# Patient Record
Sex: Female | Born: 1965 | Race: White | Hispanic: No | Smoking: Current every day smoker
Health system: Southern US, Community
[De-identification: ages and names within clinical notes are randomized; demographics above are authoritative.]

## PROBLEM LIST (undated history)

## (undated) DIAGNOSIS — E7800 Pure hypercholesterolemia, unspecified: Secondary | ICD-10-CM

## (undated) DIAGNOSIS — F4310 Post-traumatic stress disorder, unspecified: Secondary | ICD-10-CM

## (undated) DIAGNOSIS — F319 Bipolar disorder, unspecified: Secondary | ICD-10-CM

## (undated) DIAGNOSIS — F419 Anxiety disorder, unspecified: Secondary | ICD-10-CM

## (undated) DIAGNOSIS — M549 Dorsalgia, unspecified: Secondary | ICD-10-CM

## (undated) DIAGNOSIS — I10 Essential (primary) hypertension: Secondary | ICD-10-CM

## (undated) HISTORY — PX: ABLATION: SHX5711

## (undated) HISTORY — PX: BACK SURGERY: SHX140

## (undated) HISTORY — PX: TUBAL LIGATION: SHX77

---

## 2012-02-24 DIAGNOSIS — F339 Major depressive disorder, recurrent, unspecified: Secondary | ICD-10-CM | POA: Insufficient documentation

## 2015-02-06 DIAGNOSIS — D241 Benign neoplasm of right breast: Secondary | ICD-10-CM | POA: Insufficient documentation

## 2015-08-09 ENCOUNTER — Emergency Department (HOSPITAL_COMMUNITY): Payer: Medicaid Other

## 2015-08-09 ENCOUNTER — Emergency Department (HOSPITAL_COMMUNITY)
Admission: EM | Admit: 2015-08-09 | Discharge: 2015-08-09 | Discharge status: Against Medical Advice | Payer: Medicaid Other | Attending: Emergency Medicine | Admitting: Emergency Medicine

## 2015-08-09 ENCOUNTER — Encounter (HOSPITAL_COMMUNITY): Payer: Self-pay | Admitting: Family Medicine

## 2015-08-09 DIAGNOSIS — R0789 Other chest pain: Secondary | ICD-10-CM | POA: Insufficient documentation

## 2015-08-09 DIAGNOSIS — Z8639 Personal history of other endocrine, nutritional and metabolic disease: Secondary | ICD-10-CM | POA: Diagnosis not present

## 2015-08-09 DIAGNOSIS — Z8659 Personal history of other mental and behavioral disorders: Secondary | ICD-10-CM | POA: Diagnosis not present

## 2015-08-09 DIAGNOSIS — R51 Headache: Secondary | ICD-10-CM | POA: Diagnosis present

## 2015-08-09 DIAGNOSIS — R110 Nausea: Secondary | ICD-10-CM | POA: Insufficient documentation

## 2015-08-09 DIAGNOSIS — R519 Headache, unspecified: Secondary | ICD-10-CM

## 2015-08-09 DIAGNOSIS — Z880 Allergy status to penicillin: Secondary | ICD-10-CM | POA: Insufficient documentation

## 2015-08-09 DIAGNOSIS — I10 Essential (primary) hypertension: Secondary | ICD-10-CM | POA: Insufficient documentation

## 2015-08-09 DIAGNOSIS — Z720 Tobacco use: Secondary | ICD-10-CM | POA: Diagnosis not present

## 2015-08-09 DIAGNOSIS — R200 Anesthesia of skin: Secondary | ICD-10-CM | POA: Insufficient documentation

## 2015-08-09 DIAGNOSIS — R079 Chest pain, unspecified: Secondary | ICD-10-CM

## 2015-08-09 DIAGNOSIS — R0602 Shortness of breath: Secondary | ICD-10-CM | POA: Insufficient documentation

## 2015-08-09 HISTORY — DX: Pure hypercholesterolemia, unspecified: E78.00

## 2015-08-09 HISTORY — DX: Dorsalgia, unspecified: M54.9

## 2015-08-09 HISTORY — DX: Essential (primary) hypertension: I10

## 2015-08-09 HISTORY — DX: Bipolar disorder, unspecified: F31.9

## 2015-08-09 HISTORY — DX: Anxiety disorder, unspecified: F41.9

## 2015-08-09 HISTORY — DX: Post-traumatic stress disorder, unspecified: F43.10

## 2015-08-09 LAB — BASIC METABOLIC PANEL
ANION GAP: 12 (ref 5–15)
BUN: 8 mg/dL (ref 6–20)
CHLORIDE: 99 mmol/L — AB (ref 101–111)
CO2: 29 mmol/L (ref 22–32)
Calcium: 9.4 mg/dL (ref 8.9–10.3)
Creatinine, Ser: 0.78 mg/dL (ref 0.44–1.00)
GFR calc non Af Amer: >60 mL/min (ref >60)
Glucose, Bld: 89 mg/dL (ref 65–99)
POTASSIUM: 4.1 mmol/L (ref 3.5–5.1)
SODIUM: 140 mmol/L (ref 135–145)

## 2015-08-09 LAB — CBC
HEMATOCRIT: 45.5 % (ref 36.0–46.0)
HEMOGLOBIN: 14.9 g/dL (ref 12.0–15.0)
MCH: 31.6 pg (ref 26.0–34.0)
MCHC: 32.7 g/dL (ref 30.0–36.0)
MCV: 96.6 fL (ref 78.0–100.0)
PLATELETS: 305 10*3/uL (ref 150–400)
RBC: 4.71 MIL/uL (ref 3.87–5.11)
RDW: 13.3 % (ref 11.5–15.5)
WBC: 7.5 10*3/uL (ref 4.0–10.5)

## 2015-08-09 LAB — I-STAT TROPONIN, ED
TROPONIN I, POC: 0.01 ng/mL (ref 0.00–0.08)
Troponin i, poc: 0 ng/mL (ref 0.00–0.08)

## 2015-08-09 LAB — D-DIMER, QUANTITATIVE (NOT AT ARMC): D DIMER QUANT: 0.55 ug{FEU}/mL — AB (ref 0.00–0.48)

## 2015-08-09 MED ORDER — MORPHINE SULFATE (PF) 4 MG/ML IV SOLN
4.0000 mg | Freq: Once | INTRAVENOUS | Status: AC
  Administered 2015-08-09: 4 mg via INTRAVENOUS
  Filled 2015-08-09: qty 1

## 2015-08-09 MED ORDER — NITROGLYCERIN 0.4 MG/SPRAY TL SOLN
1.0000 | Status: DC | PRN
  Administered 2015-08-09: 1 via SUBLINGUAL
  Filled 2015-08-09: qty 4.9

## 2015-08-09 MED ORDER — IOHEXOL 350 MG/ML SOLN: 100.0000 mL | Freq: Once | INTRAVENOUS | Status: DC | PRN

## 2015-08-09 MED ORDER — ASPIRIN 81 MG PO CHEW: 324.0000 mg | CHEWABLE_TABLET | Freq: Once | ORAL | Status: DC

## 2015-08-09 MED ORDER — ONDANSETRON HCL 4 MG/2ML IJ SOLN
4.0000 mg | Freq: Once | INTRAMUSCULAR | Status: AC
  Administered 2015-08-09: 4 mg via INTRAVENOUS
  Filled 2015-08-09: qty 2

## 2015-08-09 NOTE — ED Provider Notes (Signed)
CSN: [id number]     Arrival date & time 08/09/15  1446 History   First MD Initiated Contact with Patient 08/09/15 1526     Chief Complaint  Patient presents with  . Chest Pain  . Numbness  . Headache   Patient is a 49 y.o. female presenting with chest pain and headaches.  Chest Pain Pain location:  L chest and R chest Pain quality: dull and pressure   Pain radiates to:  Does not radiate Pain radiates to the back: no   Pain severity:  Moderate Onset quality:  Gradual Timing:  Constant Progression:  Improving Chronicity:  New Context: at rest   Context: not breathing   Relieved by:  Nothing Associated symptoms: cough, headache, nausea, numbness and shortness of breath   Associated symptoms: no dizziness, no palpitations and no weakness   Headache Associated symptoms: cough, nausea and numbness   Associated symptoms: no diarrhea, no dizziness and no weakness     Past Medical History  Diagnosis Date  . Hypertension   . Anxiety   . Bipolar 1 disorder (HCC)   . PTSD (post-traumatic stress disorder)   . Back pain   . High cholesterol    Past Surgical History  Procedure Laterality Date  . Back surgery     History reviewed. No pertinent family history. Social History  Substance Use Topics  . Smoking status: Current Every Day Smoker  . Smokeless tobacco: None  . Alcohol Use: Yes   OB History    No data available     Review of Systems  Respiratory: Positive for cough, chest tightness and shortness of breath.   Cardiovascular: Positive for chest pain. Negative for palpitations and leg swelling.  Gastrointestinal: Positive for nausea. Negative for diarrhea.  Genitourinary: Negative for dysuria.  Neurological: Positive for speech difficulty, numbness and headaches. Negative for dizziness, tremors, weakness and light-headedness.  All other systems reviewed and are negative.     Allergies  Penicillins  Home Medications   Prior to Admission medications   Not on  File   BP 149/91 mmHg  Pulse 102  Temp(Src) 97.9 F (36.6 C) (Oral)  Resp 20  SpO2 95% Physical Exam  Constitutional: She is oriented to person, place, and time. She appears well-developed and well-nourished. No distress.  HENT:  Head: Normocephalic.  Eyes: Pupils are equal, round, and reactive to light.  Neck: Normal range of motion.  Cardiovascular: Normal rate.   No murmur heard. Pulmonary/Chest: Effort normal. No respiratory distress. She has no wheezes. She has no rales.  Abdominal: Soft. She exhibits no distension. There is no tenderness. There is no rebound and no guarding.  Musculoskeletal: She exhibits no edema.  Neurological: She is alert and oriented to person, place, and time. No cranial nerve deficit. She exhibits normal muscle tone. Coordination normal.  Reported left hand numbness along ulnar aspect  Normal finger to nose  Skin: Skin is warm and dry. No rash noted. She is not diaphoretic. No erythema.  Psychiatric: Her behavior is normal.  Nursing note and vitals reviewed.   ED Course  Procedures (including critical care time) Labs Review Labs Reviewed  BASIC METABOLIC PANEL - Abnormal; Notable for the following:    Chloride 99 (*)    All other components within normal limits  CBC  D-DIMER, QUANTITATIVE (NOT AT [facility])  Rosezena Sensor, ED    Imaging Review Dg Chest 2 View  08/09/2015  CLINICAL DATA:  Midsternal chest pain/ tightness and headache beginning today. Left hand  numbness since yesterday. Smoker. EXAM: CHEST  2 VIEW COMPARISON:  None. FINDINGS: The cardiomediastinal silhouette is within normal limits. There is mild central airway thickening. No confluent airspace opacity, edema, pleural effusion, or pneumothorax is identified. Mild thoracic dextroscoliosis is noted. IMPRESSION: Mild bronchitic changes. Electronically Signed   By: Sebastian AcheAllen  Grady M.D.   On: 08/09/2015 16:08   I have personally reviewed and evaluated these images and lab results as part  of my medical decision-making.   EKG Interpretation   Date/Time:  Wednesday August 09 2015 15:04:42 EDT Ventricular Rate:  100 PR Interval:  138 QRS Duration: 90 QT Interval:  358 QTC Calculation: 461 R Axis:   91 Text Interpretation:  Normal sinus rhythm Rightward axis Borderline ECG no  acute ST/T changes No old tracing to compare Confirmed by GOLDSTON  MD,  SCOTT ([id number]) on 08/09/2015 3:33:22 PM      MDM   Patient presents with multiple complaints including left hand numbness that started yesterday that is increasing in intensity as well as chest tightness and shortness of breath started today. Patient also complains of difficulty finding words and some intermittent dysarthria. Patient well-appearing. She has no neurological findings and her left hand numbness that appears to be on the ulnar aspect.  Patient low [place] Criteria but [facility] to [facility]. D-dimer is positive.   At this time patient is requesting to leave AGAINST MEDICAL ADVICE. She states that she has no other ride and that her ride is leaving. We discussed that she shouldn't leave with a positive d-dimer and that she could have a blood clot in her lungs. We also discussed at length our desire to continue a neurological workup.  Patient wants to leave against medical advice. Patient understands that his/her actions will lead to inadequate medical workup, and that he/she is at risk of complications of missed diagnosis, which includes morbidity and mortality.  Patient is demonstrating good capacity to make decision. Patient understands that he/she needs to return to the ER immediately if his/her symptoms get worse.  Final diagnoses:  Headache  SOB (shortness of breath)  Chest pain, unspecified chest pain type  Numbness of left hand      Deirdre PeerJeremiah Blayke Cordrey, MD 08/10/15 [id number]  Pricilla LovelessScott Goldston, MD 08/19/15 [phone]

## 2015-08-09 NOTE — ED Notes (Signed)
Pt here for chest pain that started today with headache. sts yesterday started with numbness in hand radiating to elbow on the left side.

## 2015-08-09 NOTE — Discharge Instructions (Signed)
°Emergency Department Resource Guide °1) Find a Doctor and Pay Out of Pocket °Although you won't have to find out who is covered by your insurance plan, it is a good idea to ask around and get recommendations. You will then need to call the office and see if the doctor you have chosen will accept you as a new patient and what types of options they offer for patients who are self-pay. Some doctors offer discounts or will set up payment plans for their patients who do not have insurance, but you will need to ask so you aren't surprised when you get to your appointment. ° °2) Contact Your Local Health Department °Not all health departments have doctors that can see patients for sick visits, but many do, so it is worth a call to see if yours does. If you don't know where your local health department is, you can check in your phone book. The CDC also has a tool to help you locate your state's health department, and many state websites also have listings of all of their local health departments. ° °3) Find a Walk-in Clinic °If your illness is not likely to be very severe or complicated, you may want to try a walk in clinic. These are popping up all over the country in pharmacies, drugstores, and shopping centers. They're usually staffed by nurse practitioners or physician assistants that have been trained to treat common illnesses and complaints. They're usually fairly quick and inexpensive. However, if you have serious medical issues or chronic medical problems, these are probably not your best option. ° °No Primary Care Doctor: °- Call [facility] at  832-8000 - they can help you locate a primary care doctor that  accepts your insurance, provides certain services, etc. °- Physician Referral Service- 1-800-533-3463 ° °Chronic Pain Problems: °Organization         Address  Phone   Notes  °[facility]  (336) 297-2271 Patients need to be referred by their primary care doctor.  ° °Medication  Assistance: °Organization         Address  Phone   Notes  °[place] [facility] [address] °[place], [place] [address] (336) 641-8030 --Must be a resident of [place] °-- Must have NO insurance coverage whatsoever (no Medicaid/ Medicare, etc.) °-- The pt. MUST have a primary care doctor that directs their care regularly and follows them in the community °  °[facility]  (866) 331-1348   °[employer]  (888) 892-1162   ° °Agencies that provide inexpensive medical care: °Organization         Address  Phone   Notes  ° Family Medicine  (336) 832-8035   ° Internal Medicine    (336) 832-7272   °[facility] [address] °[place], [place] [address] (336) 832-4777   °[facility] [address], °[place] (336) 271-4999   °[facility]    (336) 373-0678   °[facility]    (336) 272-1050   °[facility] ° [address], [place] Phone:  (336) 832-4444, Fax:  (336) 832-4440 Hours of Operation:  9 am - 6 pm, M-F.  Also accepts Medicaid/Medicare and self-pay.  °[facility] ° [address], [place] Phone: (336) 832-3150, Fax: (336) 832-3151. Hours of Operation:  8:30 am - 5:30 pm, M-F.  Also accepts Medicaid and self-pay.  °[facility] [place] [address], [place] Phone: (336) 878-6027   °[facility] [address], [place], [place] (336)723-1848, Ext. 123 Mondays & Thursdays: 7-9 AM.  First 15 patients are seen on a first come, first serve basis. °  ° °Medicaid-accepting [place] Providers: ° °Organization         Address  Phone   Notes  °[facility] [address], [place] (336) 641-2100 Also accepts self-pay patients.  °[facility] [address], [place] ° (336) 856-9996   °[facility] [address], [place]  (336) 288-8857   °[facility] [address] [place] [address], [place] (336) 299-7000   °Veita Bland [address], [place]  ° (336) 373-1557 Only accepts [place] Access Medicaid patients after they have their name applied to their card.  ° °Self-Pay (no insurance) in [place]: ° °Organization         Address  Phone   Notes  °Sickle Cell Patients, [facility] [address], [place] (336) 832-1970   °[facility] [address], [place] (336) 832-4400   ° [facility] ° [address], [place] (336) 992-4800   °[facility]/Dr. Osei-Bonsu ° [address] [place] [address], [place] or [address], [place] (336) 841-8500 Phone number for both [place] and [place] locations is the same.  °[facility] [address], [place] (336) 299-0000   °[facility] [address] [place] [address], [place] or [address] (336) 852-7530 °(336) 878-2260   °[facility] [address], [place] (336) 350-1642, phone; (336) 294-5005, fax Sees patients 1st and 3rd Saturday of every month.  Must not qualify for public or private insurance (i.e. Medicaid, Medicare, [place] Health Choice, Veterans' Benefits) • Household income should be no more than 200% of the poverty level •The clinic cannot treat you if you are pregnant or think you are pregnant • Sexually transmitted diseases are not treated at the clinic.  ° ° °Dental Care: °Organization         Address  Phone  Notes  °[place] [facility] [address], [place] (336) 641-6152 Accepts children up to age 21 who are enrolled in Medicaid or [place] Health Choice; pregnant women with a Medicaid card; and children who have applied for Medicaid or [place] Health Choice, but were declined, whose parents can pay a reduced fee at time of service.  °[place]  [facility] [place]  [address], [place] (336) 641-7733 Accepts children up to age 21 who are enrolled in Medicaid or [place] Health Choice; pregnant women with a Medicaid card; and children who have applied for Medicaid or [place] Health Choice, but were declined, whose parents can pay a reduced fee at time of service.  °[facility] ° [address],  (336) 641-4533 Patients are seen by appointment only. Walk-ins are not accepted. [facility] will see patients 18 years of age and older. °Monday - Tuesday (8am-5pm) °Most Wednesdays (8:30-5pm) °$30 per visit, cash only  °[facility] ° [address], [place] (336) 641-4533 Patients are seen by appointment only. Walk-ins are not accepted. [facility] will see patients 18 years of age and older. °One   Wednesday Evening (Monthly: Volunteer Based).  $30 per visit, cash only  °[facility]  (919) 537-3737 for adults; Children under age 4, call Graduate Pediatric Dentistry at (919) [phone]. Children aged 4-14, please call (919) 537-3737 to request a pediatric application. ° Dental services are provided in all areas of dental care including fillings, crowns and bridges, complete and partial dentures, implants, gum treatment, root canals, and extractions. Preventive care is also provided. Treatment is provided to both adults and children. °Patients are selected via a lottery and there is often a waiting list. °  °[facility] [address], °[place] ° (336) 763-8833 [email or web address] °  °[facility] [address], [place], [place] (336)723-1848, Ext. 123 Second and Fourth Thursday of each month, opens at 6:30 AM; Clinic ends at 9 AM.  Patients are seen on a first-come first-served basis, and a limited number are seen during each clinic.  ° °[facility] ° [address], [place], [place] (336) 723-7904    Eligibility Requirements °You must have lived in [place], [place], or [place] counties for at least the last three months. °  You cannot be eligible for state or federal sponsored healthcare insurance, including Veterans Administration, Medicaid, or Medicare. °  You generally cannot be eligible for healthcare insurance through your employer.  °  How to apply: °Eligibility screenings are held every Tuesday and Wednesday afternoon from 1:00 pm until 4:00 pm. You do not need an appointment for the interview!  °[facility] [address], [place], [place] 336-631-2330   °[place] [facility]  336-342-8273   °[place]  336-703-3100   °[facility]  336-570-6415   ° °Behavioral Health Resources in the Community: °Intensive Outpatient Programs °Organization         Address  Phone  Notes  °[facility] [address], [place], [place] 336-878-6098   °[facility] [address], [place], [place] 336-832-9800   °[facility] [address], [place], [place] ° 336-882-2125   °[facility] [address],  °[place], [place] 1-800-853-5163 or 336-641-4981   °Substance Abuse Resources °Organization         Address  Phone  Notes  °[facility]  336-882-2125   °[facility]  336-784-9470   °[facility]  336-285-9073   °[facility]  336-845-3988   °Residential & Outpatient Substance Abuse Program  1-800-659-3381   °Psychological Services °Organization         Address  Phone  Notes  °[facility]  336- 832-9600   °[facility]  336- 378-7881   °[facility] [address], [place] 1-800-853-5163 or 336-641-4981   ° °Mobile Crisis Teams °Organization         Address  Phone  Notes  °[facility], Mobile Crisis Care Unit  1-877-626-1772   °Assertive °[facility] ° [address]  [place], [place] 336-834-9664   °Sharon DeEsch [address] °[place] 336-554-5454   ° °Self-Help/Support Groups °Organization         Address  Phone             Notes  °[facility] [place] [facility] - variety of support groups  336- 373-1402 Call for more information  °Narcotics Anonymous (NA), [facility] [address], °[place]   2 meetings at this location  ° °  Residential Treatment Programs °Organization         Address  Phone  Notes  °[facility] [address],    °[place]  1-866-801-8205   °[facility] ° [address], [place], [place] 704-293-8524   °[facility] [address], [place] 336-845-3988 Admissions: 8am-3pm M-F  °[facility] [address]    °[place], [place] 336-841-1104   °[facility] [address], [place], [place] 336-379-7146   °[facility] [address]  °[place], [place] 336-285-9073   °[facility] - Intensive Outpatient [address], [place], [place] 336-852-3033   °[facility] [address]  °[place], [place] 1-877-615-2722 or 336-784-9470   °[facility] [address] [place], [place] 336-227-7417 Accepts Medicaid  °[facility] [address]  °[place] [employer] 1-800-659-3381 Substance Abuse/Addiction Treatment  ° °[place] Behavioral Health Resources °Organization         Address  Phone  Notes  °[facility]  (888) 581-9988   °Julie Brannon, PhD [address] [place], [place]   (336) 349-5553 or (336) 951-0000   °[facility]   [address] °[place], [place] (336) 349-4454   °[facility] [address], [place], [place] (336) 342-8316 Insurance/Medicaid/sponsorship through [facility]  °[facility] [address]                                    [place], [place] (336) 342-8316 Therapy/tele-psych/case    °[facility] [address]  ° [place], [place] (336) 349-2233    °Dr. Arfeen  (336) 349-4544   °[facility] [place]  [employer] [place] [facility] 1) [address], [place] °2) [address], [place] °3)  [address], [place] (336) 349-3220 °(336) 342-7768 ° °(336) 342-8140   °[place] [facility] (336) 342-1394 or (336) 342-3537 (After Hours)    ° ° °

## 2015-08-12 ENCOUNTER — Emergency Department (HOSPITAL_COMMUNITY): Payer: Medicaid Other

## 2015-08-12 ENCOUNTER — Encounter (HOSPITAL_COMMUNITY): Payer: Self-pay | Admitting: Emergency Medicine

## 2015-08-12 ENCOUNTER — Emergency Department (HOSPITAL_COMMUNITY)
Admission: EM | Admit: 2015-08-12 | Discharge: 2015-08-12 | Disposition: A | Discharge status: Home / Self Care | Payer: Medicaid Other | Attending: Emergency Medicine | Admitting: Emergency Medicine

## 2015-08-12 DIAGNOSIS — F319 Bipolar disorder, unspecified: Secondary | ICD-10-CM | POA: Diagnosis not present

## 2015-08-12 DIAGNOSIS — R200 Anesthesia of skin: Secondary | ICD-10-CM | POA: Diagnosis not present

## 2015-08-12 DIAGNOSIS — R1013 Epigastric pain: Secondary | ICD-10-CM | POA: Diagnosis not present

## 2015-08-12 DIAGNOSIS — E782 Mixed hyperlipidemia: Secondary | ICD-10-CM | POA: Insufficient documentation

## 2015-08-12 DIAGNOSIS — Z79899 Other long term (current) drug therapy: Secondary | ICD-10-CM | POA: Insufficient documentation

## 2015-08-12 DIAGNOSIS — Z720 Tobacco use: Secondary | ICD-10-CM | POA: Insufficient documentation

## 2015-08-12 DIAGNOSIS — I10 Essential (primary) hypertension: Secondary | ICD-10-CM | POA: Diagnosis not present

## 2015-08-12 DIAGNOSIS — R079 Chest pain, unspecified: Secondary | ICD-10-CM | POA: Diagnosis not present

## 2015-08-12 LAB — BASIC METABOLIC PANEL
Anion gap: 9 (ref 5–15)
BUN: 11 mg/dL (ref 6–20)
CO2: 27 mmol/L (ref 22–32)
Calcium: 9 mg/dL (ref 8.9–10.3)
Chloride: 99 mmol/L — ABNORMAL LOW (ref 101–111)
Creatinine, Ser: 0.62 mg/dL (ref 0.44–1.00)
GFR calc Af Amer: >60 mL/min (ref >60)
GFR calc non Af Amer: >60 mL/min (ref >60)
Glucose, Bld: 83 mg/dL (ref 65–99)
Potassium: 4.1 mmol/L (ref 3.5–5.1)
Sodium: 135 mmol/L (ref 135–145)

## 2015-08-12 LAB — HEPATIC FUNCTION PANEL
ALT: 23 U/L (ref 14–54)
AST: 29 U/L (ref 15–41)
Albumin: 3.8 g/dL (ref 3.5–5.0)
Alkaline Phosphatase: 80 U/L (ref 38–126)
Bilirubin, Direct: <0.1 mg/dL — ABNORMAL LOW (ref 0.1–0.5)
Total Bilirubin: 0.2 mg/dL — ABNORMAL LOW (ref 0.3–1.2)
Total Protein: 7.1 g/dL (ref 6.5–8.1)

## 2015-08-12 LAB — CBC
HCT: 46 % (ref 36.0–46.0)
Hemoglobin: 15.1 g/dL — ABNORMAL HIGH (ref 12.0–15.0)
MCH: 31.9 pg (ref 26.0–34.0)
MCHC: 32.8 g/dL (ref 30.0–36.0)
MCV: 97 fL (ref 78.0–100.0)
Platelets: 316 10*3/uL (ref 150–400)
RBC: 4.74 MIL/uL (ref 3.87–5.11)
RDW: 13.2 % (ref 11.5–15.5)
WBC: 9 10*3/uL (ref 4.0–10.5)

## 2015-08-12 LAB — I-STAT TROPONIN, ED: Troponin i, poc: 0 ng/mL (ref 0.00–0.08)

## 2015-08-12 LAB — LIPASE, BLOOD: Lipase: 24 U/L (ref 11–51)

## 2015-08-12 MED ORDER — IPRATROPIUM-ALBUTEROL 0.5-2.5 (3) MG/3ML IN SOLN
3.0000 mL | Freq: Once | RESPIRATORY_TRACT | Status: AC
  Administered 2015-08-12: 3 mL via RESPIRATORY_TRACT
  Filled 2015-08-12: qty 3

## 2015-08-12 MED ORDER — MORPHINE SULFATE (PF) 4 MG/ML IV SOLN
4.0000 mg | Freq: Once | INTRAVENOUS | Status: AC
  Administered 2015-08-12: 4 mg via INTRAVENOUS
  Filled 2015-08-12: qty 1

## 2015-08-12 MED ORDER — IOHEXOL 350 MG/ML SOLN
100.0000 mL | Freq: Once | INTRAVENOUS | Status: AC | PRN
  Administered 2015-08-12: 100 mL via INTRAVENOUS

## 2015-08-12 MED ORDER — IOHEXOL 350 MG/ML SOLN
80.0000 mL | Freq: Once | INTRAVENOUS | Status: AC | PRN
  Administered 2015-08-12: 80 mL via INTRAVENOUS

## 2015-08-12 MED ORDER — KETOROLAC TROMETHAMINE 15 MG/ML IJ SOLN
15.0000 mg | Freq: Once | INTRAMUSCULAR | Status: AC
  Administered 2015-08-12: 15 mg via INTRAVENOUS
  Filled 2015-08-12: qty 1

## 2015-08-12 NOTE — ED Notes (Signed)
NAD at this time. Pt is going home.   

## 2015-08-12 NOTE — ED Provider Notes (Signed)
CSN: [id number]     Arrival date & time 08/12/15  1343 History   First MD Initiated Contact with Patient 08/12/15 1424     Chief Complaint  Patient presents with  . Chest Pain  . Numbness  . Shortness of Breath     (Consider location/radiation/quality/duration/timing/severity/associated sxs/prior Treatment) HPI   49yF with multiple complaints. Waxing/waning pain in R lower chest/epigastrium for several days. "Feels like contractions." Lasts minutes. Radiates through to back. No appreciable exacerbating or relieving factors. No fever or chills. No cough. Mild SOB. No unusual leg pain or swelling. Additionally complaining of numbness in L hand. This has been persistent since onset. No numbness or tingling otherwise. No loss of strength. No headaches. No neck pain. Denies any trauma. Patient was seen in the emergency room on 10/19 for the same complaints. She left AGAINST MEDICAL ADVICE because of transportation issues. She did have a minimally elevated d-dimer at that time. She did not receive recommended CT angiography.  Past Medical History  Diagnosis Date  . Hypertension   . Anxiety   . Bipolar 1 disorder (HCC)   . PTSD (post-traumatic stress disorder)   . Back pain   . High cholesterol    Past Surgical History  Procedure Laterality Date  . Back surgery     No family history on file. Social History  Substance Use Topics  . Smoking status: Current Every Day Smoker  . Smokeless tobacco: None  . Alcohol Use: Yes   OB History    No data available     Review of Systems  All systems reviewed and negative, other than as noted in HPI.   Allergies  Influenza vaccines; Penicillins; Tape; and Tetanus toxoids  Home Medications   Prior to Admission medications   Medication Sig Start Date End Date Taking? Authorizing Provider  benazepril (LOTENSIN) 10 MG tablet Take 10 mg by mouth daily.   Yes Historical Provider, MD  ibuprofen (ADVIL,MOTRIN) 200 MG tablet Take 200 mg by  mouth every 6 (six) hours as needed for fever.   Yes Historical Provider, MD   BP 150/93 mmHg  Pulse 90  Temp(Src) 97.7 F (36.5 C)  Resp 17  Ht  (1.651 m)  Wt 241 lb (109.317 kg)  BMI 40.10 kg/m2  SpO2 92% Physical Exam  Constitutional: She appears well-developed and well-nourished. No distress.  HENT:  Head: Normocephalic and atraumatic.  Eyes: Conjunctivae are normal. Right eye exhibits no discharge. Left eye exhibits no discharge.  Neck: Neck supple.  Cardiovascular: Normal rate, regular rhythm and normal heart sounds.  Exam reveals no gallop and no friction rub.   No murmur heard. Pulmonary/Chest: Effort normal and breath sounds normal. No respiratory distress.  Abdominal: Soft. She exhibits no distension. There is tenderness.  Mild TTP RUQ and epigastric region  Musculoskeletal: She exhibits no edema or tenderness.  Neurological: She is alert. No cranial nerve deficit. She exhibits normal muscle tone. Coordination normal.  Decreased sensation to light touch, ulnar aspect distal L wrist, hand, pinky fingers and ulnar aspect ring finger. Sensation intact otherwise.    Skin: Skin is warm and dry.  Psychiatric: She has a normal mood and affect. Her behavior is normal. Thought content normal.  Nursing note and vitals reviewed.   ED Course  Procedures (including critical care time) Labs Review Labs Reviewed  BASIC METABOLIC PANEL - Abnormal; Notable for the following:    Chloride 99 (*)    All other components within normal limits  CBC -  Abnormal; Notable for the following:    Hemoglobin 15.1 (*)    All other components within normal limits  I-STAT TROPOININ, ED    Imaging Review Dg Chest 2 View  08/12/2015  CLINICAL DATA:  Chest pain EXAM: CHEST  2 VIEW COMPARISON:  08/09/2015 FINDINGS: Lungs are clear.  No pleural effusion or pneumothorax. The heart is normal in size. Mild degenerative changes of the visualized thoracolumbar spine. IMPRESSION: No evidence of acute  cardiopulmonary disease. Electronically Signed   By: Charline Bills M.D.   On: 08/12/2015 15:25   Ct Angio Chest Pe W/cm &/or Wo Cm  08/12/2015  CLINICAL DATA:  49 year old female with acute chest pain and shortness of breath. EXAM: CT ANGIOGRAPHY CHEST WITH CONTRAST TECHNIQUE: Multidetector CT imaging of the chest was performed using the standard protocol during bolus administration of intravenous contrast. Repeat imaging was performed secondary to suboptimal contrast opacification on the first run. Multiplanar CT image reconstructions and MIPs were obtained to evaluate the vascular anatomy. CONTRAST:  OMNIPAQUE IOHEXOL 350 MG/ML SOLN COMPARISON:  08/12/2015 and prior chest radiographs FINDINGS: This is a technically adequate study Mediastinum/Nodes: No pulmonary emboli are identified but mild respiratory motion artifact slightly decreases sensitivity. The heart and great vessels are unremarkable. There is no evidence of thoracic aortic aneurysm or definite dissection. No pericardial effusion or enlarged lymph nodes identified. Lungs/Pleura: A 5 mm nonspecific right middle lobe nodule is identified. No pleural effusion, pneumothorax airspace disease, consolidation, mass or endobronchial/endotracheal lesion identified. Upper abdomen: Unremarkable Musculoskeletal: No acute or suspicious abnormalities. Mild thoracic scoliosis is noted. Review of the MIP images confirms the above findings. IMPRESSION: No evidence of acute abnormality or pulmonary emboli. 5 mm right middle lobe nodule. If the patient is at high risk for bronchogenic carcinoma, follow-up chest CT at 6-12 months is recommended. If the patient is at low risk for bronchogenic carcinoma, follow-up chest CT at 12 months is recommended. This recommendation follows the consensus statement: Guidelines for Management of Small Pulmonary Nodules Detected on CT Scans: A Statement from the Fleischner Society as published in Radiology 2005;237:395-400.  Electronically Signed   By: Harmon Pier M.D.   On: 08/12/2015 18:23   US Abdomen Limited  08/12/2015  CLINICAL DATA:  Epigastric pain and right upper quadrant pain. Additional history of hypertension appear EXAM: US ABDOMEN LIMITED - RIGHT UPPER QUADRANT COMPARISON:  Previous abdomen ultrasound dated 07/14/2014. FINDINGS: Gallbladder: No gallstones or wall thickening visualized. No sonographic Murphy sign noted. Common bile duct: Diameter: Within normal limits at 6.2 mm diameter. No bile duct stone demonstrated. Liver: Liver is diffusely echogenic consistent with fatty infiltration. Small focus of fatty sparing again noted adjacent to the gallbladder fossa. Portal vein is shown to be patent with appropriate hepatopetal directional blood flow. No free fluid identified in the right upper abdomen. IMPRESSION: 1. Overall, no acute findings. 2. Fatty infiltration of the liver. 3. Gallbladder appears normal. No gallstones seen. No evidence of cholecystitis. 4. No intrahepatic or extrahepatic bile duct dilatation seen. Electronically Signed   By: Bary Richard M.D.   On: 08/12/2015 17:26   I have personally reviewed and evaluated these images and lab results as part of my medical decision-making.   EKG Interpretation   Date/Time:  Saturday August 12 2015 13:52:47 EDT Ventricular Rate:  90 PR Interval:  148 QRS Duration: 90 QT Interval:  364 QTC Calculation: 445 R Axis:   96 Text Interpretation:  Normal sinus rhythm Rightward axis No significant  change since last  tracing Confirmed by Juleen ChinaKOHUT  MD, Cydni Reddoch [phone]([id number]) on  08/12/2015 4:09:04 PM      MDM   Final diagnoses:  Chest pain, unspecified chest pain type  Epigastric pain  Numbness of left hand    49yF with multiple complaints. Seen in ED recently for similar. Had elevated d-dimer but left AMA prior to getting CTa. Mild hypoxemia again noted today. Will obtain CT. Maybe bronchitis. Mild wheezing noted on exam, but no increased WOB and  otherwise not distressed. Neb given.  L hand numbness. This is consistent with peripheral neuropathy of ulnar nerve. Isolated to ulnar aspect of L hand, pinky and ring fingers. Nonfocal neuro exam otherwise. Not consistent with CVA. I get impression that her CP/upper abdominal pain may potentially be biliary colic. Waxes/wanes. Not exertional. Epigastric/RUQ tenderness on exam. Will add on lipase and LFTs. Radiates into back. Consider dissection. Will protocol CT for possible PE, but should still be able to identify a dissection. That said, I feel dissection is not likely. Will obtain RUQ US. Atypical for ACS. EKG unchanged from prior. Again has a normal troponin after several days of symptoms.   If CT negative for PE or other emergent pathology, then I feel patient can be discharged. Surgical FU if US shows evidence of cholelithiasis. GI follow-up otherwise.     Raeford RazorStephen Brae Gartman, MD 08/13/15 [phone]

## 2015-08-12 NOTE — ED Notes (Signed)
Pt. Stated, I've started having  some SOB with chest pain.  I've also had some numbness in the left hand.

## 2015-08-12 NOTE — Discharge Instructions (Signed)
Nonspecific Chest Pain  °Chest pain can be caused by many different conditions. There is always a chance that your pain could be related to something serious, such as a heart attack or a blood clot in your lungs. Chest pain can also be caused by conditions that are not life-threatening. If you have chest pain, it is very important to follow up with your health care provider. °CAUSES  °Chest pain can be caused by: °· Heartburn. °· Pneumonia or bronchitis. °· Anxiety or stress. °· Inflammation around your heart (pericarditis) or lung (pleuritis or pleurisy). °· A blood clot in your lung. °· A collapsed lung (pneumothorax). It can develop suddenly on its own (spontaneous pneumothorax) or from trauma to the chest. °· Shingles infection (varicella-zoster virus). °· Heart attack. °· Damage to the bones, muscles, and cartilage that make up your chest wall. This can include: °¨ Bruised bones due to injury. °¨ Strained muscles or cartilage due to frequent or repeated coughing or overwork. °¨ Fracture to one or more ribs. °¨ Sore cartilage due to inflammation (costochondritis). °RISK FACTORS  °Risk factors for chest pain may include: °· Activities that increase your risk for trauma or injury to your chest. °· Respiratory infections or conditions that cause frequent coughing. °· Medical conditions or overeating that can cause heartburn. °· Heart disease or family history of heart disease. °· Conditions or health behaviors that increase your risk of developing a blood clot. °· Having had chicken pox (varicella zoster). °SIGNS AND SYMPTOMS °Chest pain can feel like: °· Burning or tingling on the surface of your chest or deep in your chest. °· Crushing, pressure, aching, or squeezing pain. °· Dull or sharp pain that is worse when you move, cough, or take a deep breath. °· Pain that is also felt in your back, neck, shoulder, or arm, or pain that spreads to any of these areas. °Your chest pain may come and go, or it may stay  constant. °DIAGNOSIS °Lab tests or other studies may be needed to find the cause of your pain. Your health care provider may have you take a test called an ambulatory ECG (electrocardiogram). An ECG records your heartbeat patterns at the time the test is performed. You may also have other tests, such as: °· Transthoracic echocardiogram (TTE). During echocardiography, sound waves are used to create a picture of all of the heart structures and to look at how blood flows through your heart. °· Transesophageal echocardiogram (TEE). This is a more advanced imaging test that obtains images from inside your body. It allows your health care provider to see your heart in finer detail. °· Cardiac monitoring. This allows your health care provider to monitor your heart rate and rhythm in real time. °· Holter monitor. This is a portable device that records your heartbeat and can help to diagnose abnormal heartbeats. It allows your health care provider to track your heart activity for several days, if needed. °· Stress tests. These can be done through exercise or by taking medicine that makes your heart beat more quickly. °· Blood tests. °· Imaging tests. °TREATMENT  °Your treatment depends on what is causing your chest pain. Treatment may include: °· Medicines. These may include: °¨ Acid blockers for heartburn. °¨ Anti-inflammatory medicine. °¨ Pain medicine for inflammatory conditions. °¨ Antibiotic medicine, if an infection is present. °¨ Medicines to dissolve blood clots. °¨ Medicines to treat coronary artery disease. °· Supportive care for conditions that do not require medicines. This may include: °¨ Resting. °¨ Applying heat   or cold packs to injured areas. °¨ Limiting activities until pain decreases. °HOME CARE INSTRUCTIONS °· If you were prescribed an antibiotic medicine, finish it all even if you start to feel better. °· Avoid any activities that bring on chest pain. °· Do not use any tobacco products, including  cigarettes, chewing tobacco, or electronic cigarettes. If you need help quitting, ask your health care provider. °· Do not drink alcohol. °· Take medicines only as directed by your health care provider. °· Keep all follow-up visits as directed by your health care provider. This is important. This includes any further testing if your chest pain does not go away. °· If heartburn is the cause for your chest pain, you may be told to keep your head raised (elevated) while sleeping. This reduces the chance that acid will go from your stomach into your esophagus. °· Make lifestyle changes as directed by your health care provider. These may include: °¨ Getting regular exercise. Ask your health care provider to suggest some activities that are safe for you. °¨ Eating a heart-healthy diet. A registered dietitian can help you to learn healthy eating options. °¨ Maintaining a healthy weight. °¨ Managing diabetes, if necessary. °¨ Reducing stress. °SEEK MEDICAL CARE IF: °· Your chest pain does not go away after treatment. °· You have a rash with blisters on your chest. °· You have a fever. °SEEK IMMEDIATE MEDICAL CARE IF:  °· Your chest pain is worse. °· You have an increasing cough, or you cough up blood. °· You have severe abdominal pain. °· You have severe weakness. °· You faint. °· You have chills. °· You have sudden, unexplained chest discomfort. °· You have sudden, unexplained discomfort in your arms, back, neck, or jaw. °· You have shortness of breath at any time. °· You suddenly start to sweat, or your skin gets clammy. °· You feel nauseous or you vomit. °· You suddenly feel light-headed or dizzy. °· Your heart begins to beat quickly, or it feels like it is skipping beats. °These symptoms may represent a serious problem that is an emergency. Do not wait to see if the symptoms will go away. Get medical help right away. Call your local emergency services (911 in the [place] Do not drive yourself to the hospital. °  °This  information is not intended to replace advice given to you by your health care provider. Make sure you discuss any questions you have with your health care provider. °  °Document Released: 07/17/2005 Document Revised: 10/28/2014 Document Reviewed: 05/13/2014 °Elsevier Interactive Patient Education ©2016 [employer] ° °

## 2015-08-12 NOTE — ED Notes (Signed)
Pt. Stated, I was here on Wednesday and had to leave AMA

## 2015-08-12 NOTE — ED Provider Notes (Signed)
US, CT and CXR were negative for acute process.  Sx could be musculoskeletal, radicular. Pt is aware of the nodule.   Discussed routine follow up. At this time there does not appear to be any evidence of an acute emergency medical condition and the patient appears stable for discharge with appropriate outpatient follow up.   Joyce Hartman Autrey Human, MD 08/12/15 [phone]

## 2016-07-05 IMAGING — US US ABDOMEN LIMITED
1 series · 14 of 25 positions shown · non-contrast
Comparison: Previous abdomen ultrasound dated 07/14/2014.

CLINICAL DATA: Epigastric pain and right upper quadrant pain.
Additional history of hypertension appear

EXAM:
US ABDOMEN LIMITED - RIGHT UPPER QUADRANT

[Series 1: us abdomen limited · 0.30mm/px · 14 of 44 slices shown]
[im 1/44]
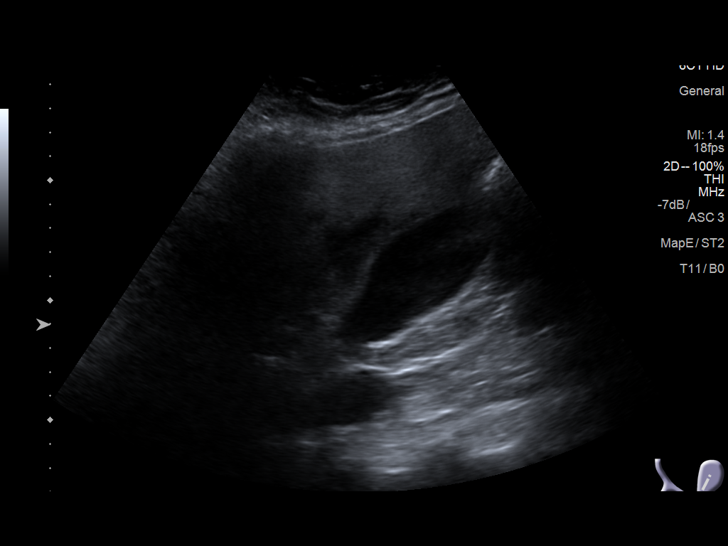
[im 4/44]
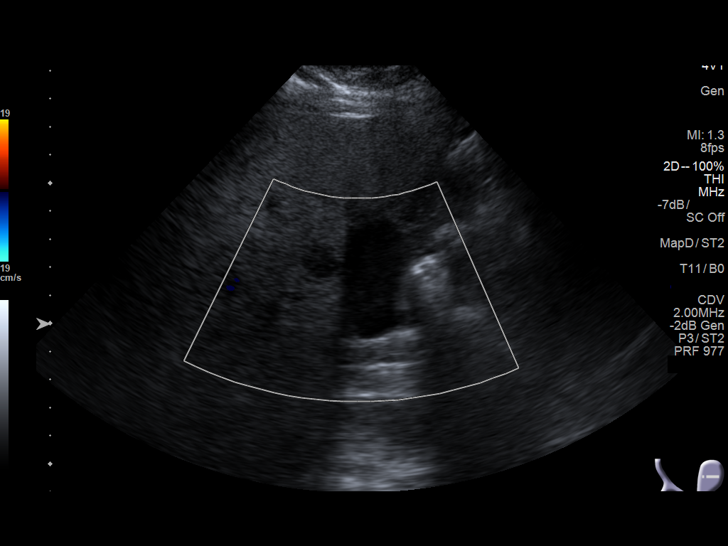
[im 8/44]
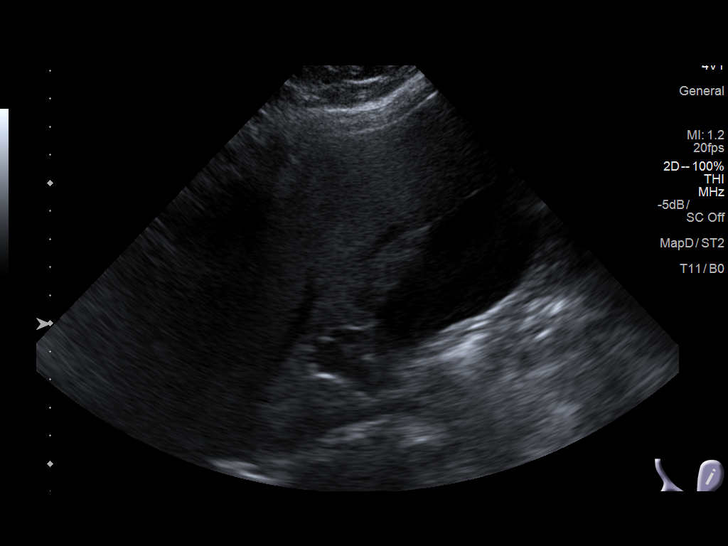
[im 11/44]
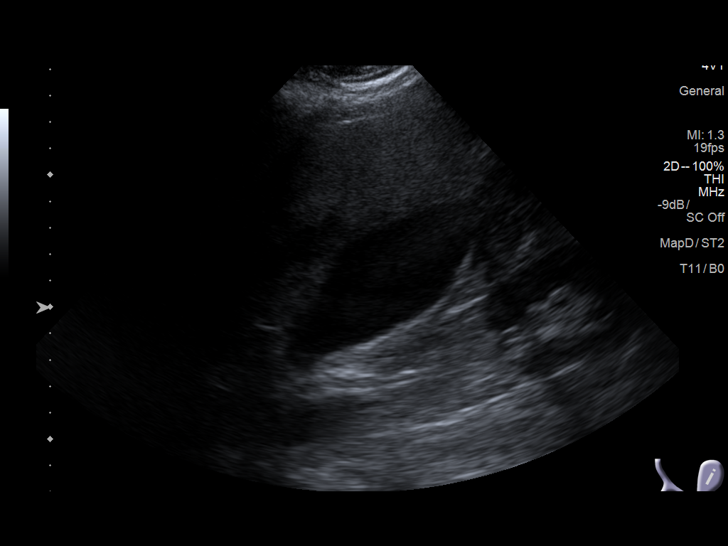
[im 15/44]
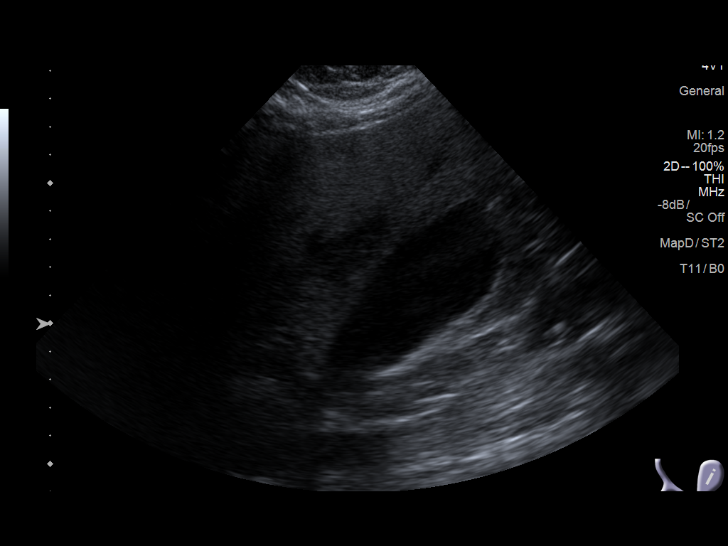
[im 17/44]
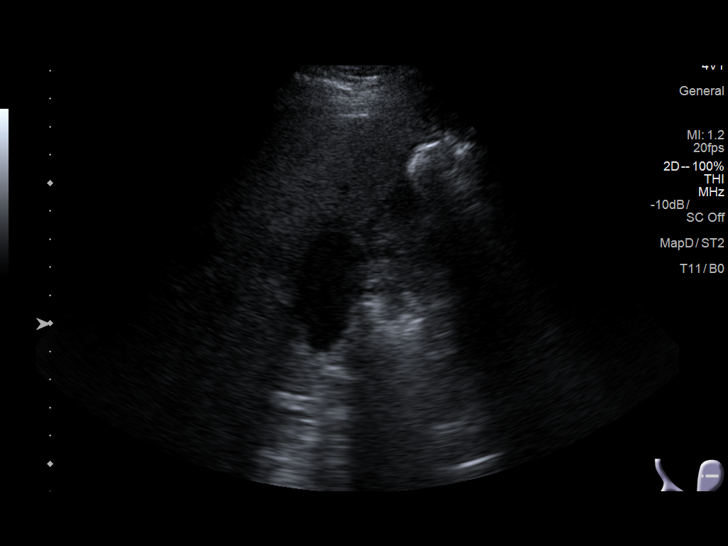
[im 20/44]
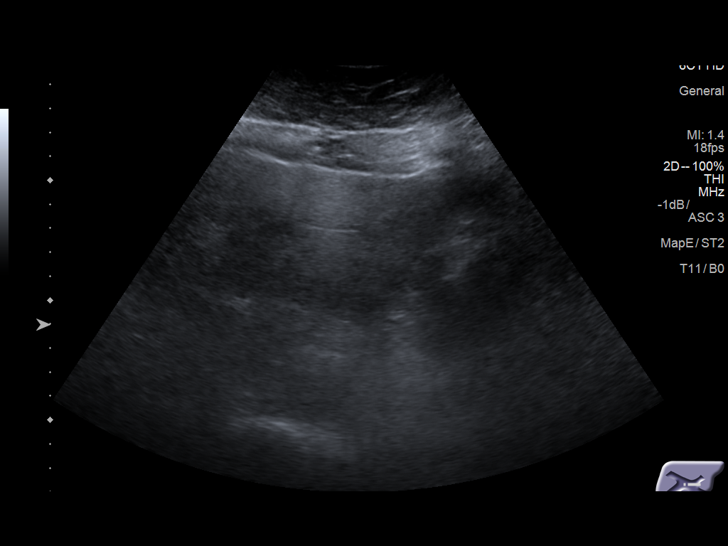
[im 24/44]
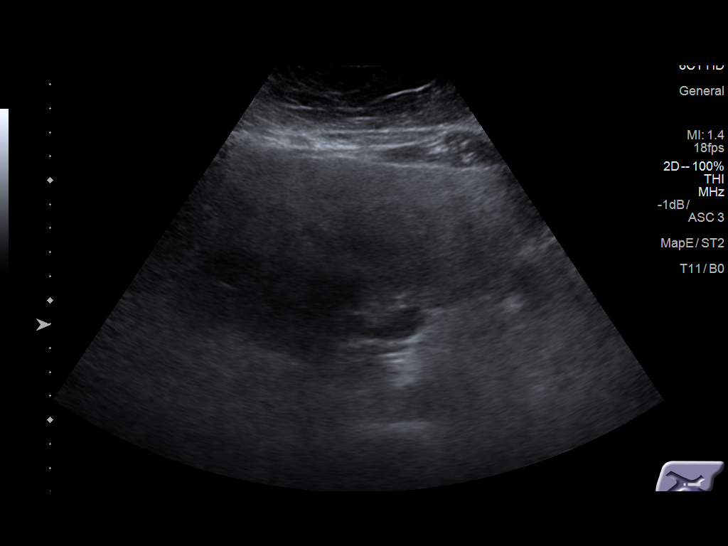
[im 27/44]
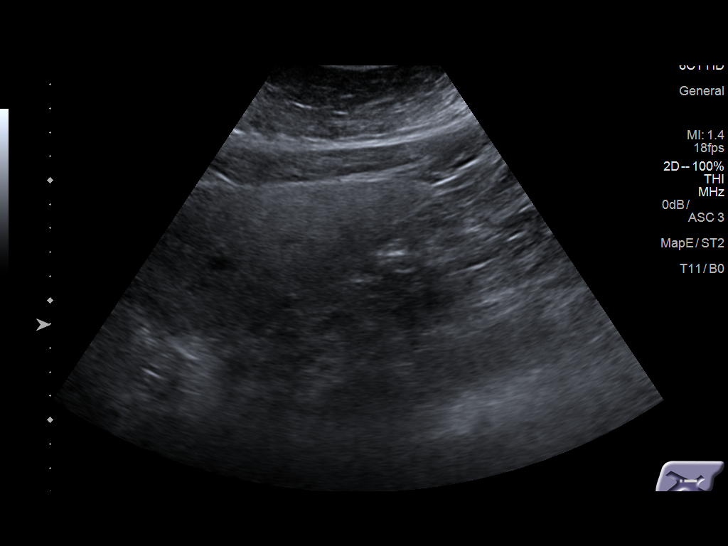
[im 29/44]
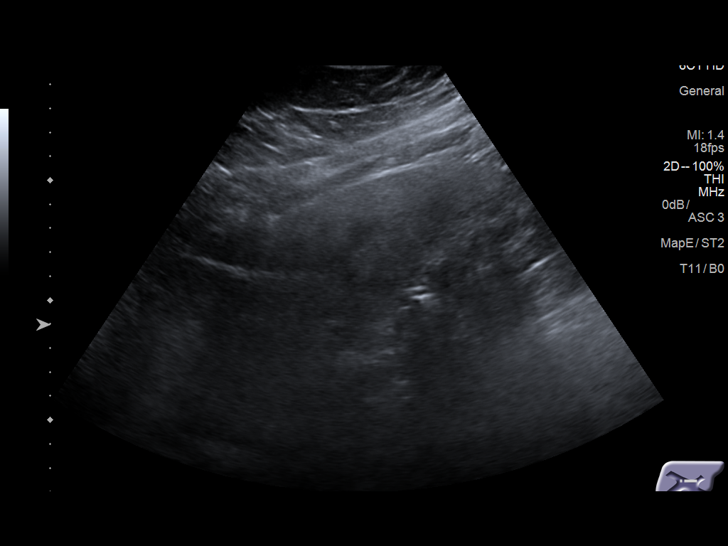
[im 33/44]
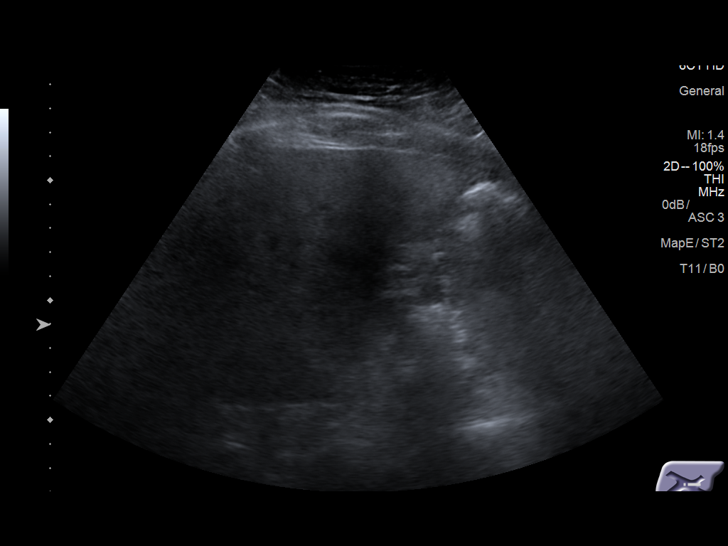
[im 36/44]
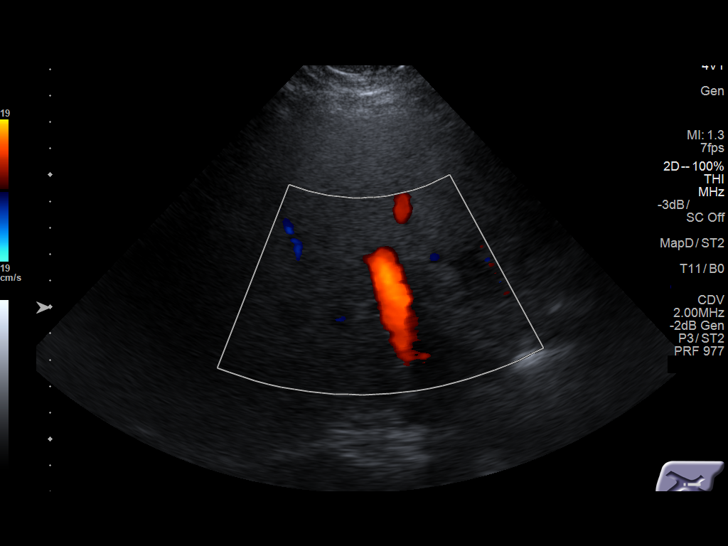
[im 40/44]
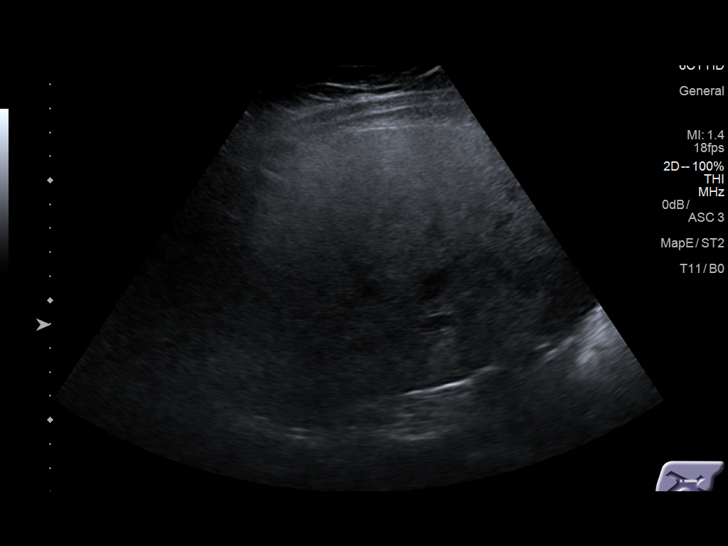
[im 44/44]
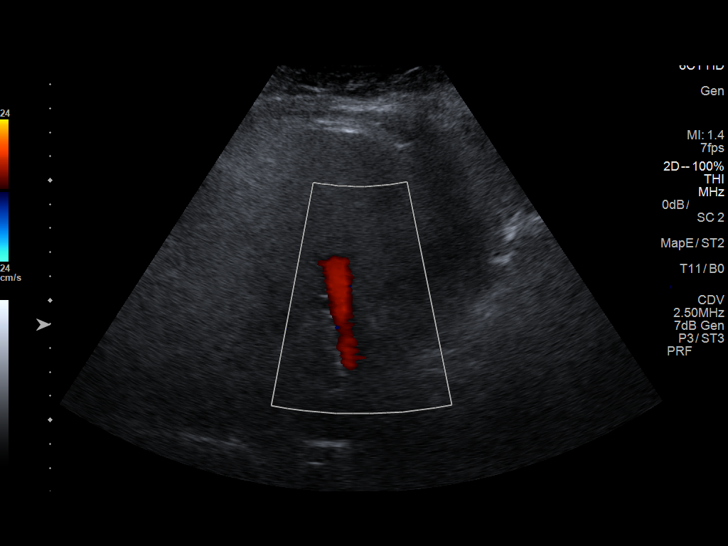

[14 of 25 positions shown; findings below may reference images not displayed]

FINDINGS: Gallbladder:

No gallstones or wall thickening visualized. No sonographic Murphy
sign noted.

Common bile duct:

Diameter: Within normal limits at 6.2 mm diameter. No bile duct
stone demonstrated.

Liver:

Liver is diffusely echogenic consistent with fatty infiltration.
Small focus of fatty sparing again noted adjacent to the gallbladder
fossa.

Portal vein is shown to be patent with appropriate hepatopetal
directional blood flow.

No free fluid identified in the right upper abdomen.
IMPRESSION: 1. Overall, no acute findings.
2. Fatty infiltration of the liver.
3. Gallbladder appears normal. No gallstones seen. No evidence of
cholecystitis.
4. No intrahepatic or extrahepatic bile duct dilatation seen.

## 2018-01-19 DIAGNOSIS — F319 Bipolar disorder, unspecified: Secondary | ICD-10-CM | POA: Insufficient documentation

## 2023-08-07 DIAGNOSIS — K219 Gastro-esophageal reflux disease without esophagitis: Secondary | ICD-10-CM | POA: Insufficient documentation

## 2023-08-07 DIAGNOSIS — Z720 Tobacco use: Secondary | ICD-10-CM | POA: Insufficient documentation

## 2023-08-19 ENCOUNTER — Encounter (INDEPENDENT_AMBULATORY_CARE_PROVIDER_SITE_OTHER): Payer: Self-pay | Admitting: *Deleted

## 2023-08-26 ENCOUNTER — Encounter (INDEPENDENT_AMBULATORY_CARE_PROVIDER_SITE_OTHER): Payer: Self-pay | Admitting: *Deleted

## 2023-09-03 ENCOUNTER — Telehealth (INDEPENDENT_AMBULATORY_CARE_PROVIDER_SITE_OTHER): Payer: Self-pay | Admitting: Gastroenterology

## 2023-09-03 ENCOUNTER — Ambulatory Visit (INDEPENDENT_AMBULATORY_CARE_PROVIDER_SITE_OTHER): Payer: Medicaid Other | Admitting: Gastroenterology

## 2023-09-03 ENCOUNTER — Encounter (INDEPENDENT_AMBULATORY_CARE_PROVIDER_SITE_OTHER): Payer: Self-pay | Admitting: Gastroenterology

## 2023-09-03 VITALS — BP 152/85 | HR 97 | Temp 98.4°F | Ht 65.0 in | Wt 225.7 lb

## 2023-09-03 DIAGNOSIS — N63 Unspecified lump in unspecified breast: Secondary | ICD-10-CM

## 2023-09-03 DIAGNOSIS — R6881 Early satiety: Secondary | ICD-10-CM

## 2023-09-03 DIAGNOSIS — R634 Abnormal weight loss: Secondary | ICD-10-CM | POA: Diagnosis not present

## 2023-09-03 DIAGNOSIS — R1013 Epigastric pain: Secondary | ICD-10-CM | POA: Diagnosis not present

## 2023-09-03 DIAGNOSIS — R911 Solitary pulmonary nodule: Secondary | ICD-10-CM

## 2023-09-03 DIAGNOSIS — R140 Abdominal distension (gaseous): Secondary | ICD-10-CM

## 2023-09-03 DIAGNOSIS — I10 Essential (primary) hypertension: Secondary | ICD-10-CM | POA: Insufficient documentation

## 2023-09-03 DIAGNOSIS — Z800 Family history of malignant neoplasm of digestive organs: Secondary | ICD-10-CM

## 2023-09-03 DIAGNOSIS — Z1211 Encounter for screening for malignant neoplasm of colon: Secondary | ICD-10-CM | POA: Insufficient documentation

## 2023-09-03 DIAGNOSIS — K838 Other specified diseases of biliary tract: Secondary | ICD-10-CM

## 2023-09-03 MED ORDER — METAMUCIL SMOOTH TEXTURE 58.6 % PO POWD
1.0000 | Freq: Two times a day (BID) | ORAL | 2 refills | Status: AC
Start: 2023-09-03 — End: 2023-12-02

## 2023-09-03 MED ORDER — METAMUCIL SMOOTH TEXTURE 58.6 % PO POWD: 1.0000 | Freq: Two times a day (BID) | ORAL | 2 refills | Status: DC

## 2023-09-03 MED ORDER — PEG 3350-KCL-NA BICARB-NACL 420 G PO SOLR: 4000.0000 mL | Freq: Once | ORAL | 0 refills | Status: AC

## 2023-09-03 NOTE — Telephone Encounter (Signed)
PA pending for EGD/TCS and MRCP via [facility].

## 2023-09-03 NOTE — Progress Notes (Signed)
Joyce Hartman , M.D. Gastroenterology & Hepatology [facility] Gastroenterology [address] [place], [place] [address] Primary Care Physician: Rebekah Chesterfield, NP [address] [place] [address]  Chief Complaint:  Abdominal pain , early satiety ,Unintentional weight loss, Abnormal CT , Screening colonoscopy with family history of premature colon cancer   History of Present Illness: Joyce Hartman is a 57 y.o. female with essential hypertension, hyperlipidemia, mental health disorder , chronic back pain on opiods who presents for evaluation of Abdominal pain , Unintentional weight loss, Abnormal CT , Screening colonoscopy with family history of premature colon cancer .  Patient went to the ER 2 weeks ago for similar complaints.  Reports severe epigastric bloating with early satiety.  Patient reports this has been going on for past couple weeks.  Patient reports losing 19 pounds in 2 weeks unintentionally.  Her bowel movements are between normal hard and liquid but abdominal pain is not related to defecation Never has had colonoscopy. Mother had colon cancer at age 23 and passed away about one month after diagnosis.  The patient denies having any nausea, vomiting, fever, chills, hematochezia, melena, hematemesis, , jaundice, pruritus   Last GMW:NUUV Last Colonoscopy:none  FHx: Mother colon cancer age 38 Surgical: no abdominal surgeries  Past Medical History: Past Medical History:  Diagnosis Date   Anxiety    Back pain    Bipolar 1 disorder (HCC)    High cholesterol    Hypertension    PTSD (post-traumatic stress disorder)     Past Surgical History: Past Surgical History:  Procedure Laterality Date   BACK SURGERY      Family History: Family History  Problem Relation Age of Onset   Colon cancer Mother 82       passed at age 39 one month after diagnosis of colon cancer   Thyroid cancer Brother    Diabetes Brother    Breast  cancer Paternal Grandmother     Social History: Social History   Tobacco Use  Smoking Status Every Day   Types: Cigarettes   Passive exposure: Current  Smokeless Tobacco Never   Social History   Substance and Sexual Activity  Alcohol Use Yes   Comment: occasional   Social History   Substance and Sexual Activity  Drug Use No    Allergies: Allergies  Allergen Reactions   Influenza Vaccines Swelling   Penicillins     Has patient had a PCN reaction causing immediate rash, facial/tongue/throat swelling, SOB or lightheadedness with hypotension: No Has patient had a PCN reaction causing severe rash involving mucus membranes or skin necrosis: No Has patient had a PCN reaction that required hospitalization No Has patient had a PCN reaction occurring within the last 10 years: No If all of the above answers are "NO", then may proceed with Cephalosporin use.   Tape Itching   Tetanus Toxoids Swelling    Medications: Current Outpatient Medications  Medication Sig Dispense Refill   atorvastatin (LIPITOR) 40 MG tablet Take 40 mg by mouth daily.     CAPLYTA 42 MG capsule Take 42 mg by mouth daily.     cetirizine (ZYRTEC) 10 MG tablet Take 10 mg by mouth daily.     fluticasone (FLONASE) 50 MCG/ACT nasal spray Place 1 spray into both nostrils daily.     furosemide (LASIX) 20 MG tablet Take 20 mg by mouth daily.     gabapentin (NEURONTIN) 300 MG capsule Take 300 mg by mouth 2 (  two) times daily.     HYDROcodone-acetaminophen (NORCO/VICODIN) 5-325 MG tablet Take 1 tablet by mouth every 6 (six) hours as needed.     ibuprofen (ADVIL,MOTRIN) 200 MG tablet Take 200 mg by mouth every 6 (six) hours as needed for fever.     lisinopril (ZESTRIL) 20 MG tablet Take 20 mg by mouth daily.     melatonin 3 MG TABS tablet Take 3 mg by mouth at bedtime.     Multiple Vitamin (MULTIVITAMIN) capsule Take 1 capsule by mouth daily.     ondansetron (ZOFRAN) 4 MG tablet Take 4 mg by mouth. As needed      pantoprazole (PROTONIX) 40 MG tablet Take 40 mg by mouth every morning.     psyllium (METAMUCIL SMOOTH TEXTURE) 58.6 % powder Take 1 packet by mouth in the morning and at bedtime. 60 packet 2   No current facility-administered medications for this visit.    Review of Systems: GENERAL: negative for malaise, night sweats HEENT: No changes in hearing or vision, no nose bleeds or other nasal problems. NECK: Negative for lumps, goiter, pain and significant neck swelling RESPIRATORY: Negative for cough, wheezing CARDIOVASCULAR: Negative for chest pain, leg swelling, palpitations, orthopnea GI: SEE HPI MUSCULOSKELETAL: Negative for joint pain or swelling, back pain, and muscle pain. SKIN: Negative for lesions, rash HEMATOLOGY Negative for prolonged bleeding, bruising easily, and swollen nodes. ENDOCRINE: Negative for cold or heat intolerance, polyuria, polydipsia and goiter. NEURO: negative for tremor, gait imbalance, syncope and seizures. The remainder of the review of systems is noncontributory.   Physical Exam: BP (!) 152/85   Pulse 97   Temp 98.4 F (36.9 C) (Oral)   Ht 5\' 5"  (1.651 m)   Wt 225 lb 11.2 oz (102.4 kg)   BMI 37.56 kg/m  GENERAL: The patient is AO x3, in no acute distress. HEENT: Head is normocephalic and atraumatic. EOMI are intact. Mouth is well hydrated and without lesions. NECK: Supple. No masses LUNGS: Clear to auscultation. No presence of rhonchi/wheezing/rales. Adequate chest expansion HEART: RRR, normal s1 and s2. ABDOMEN: Soft, nontender, no guarding, no peritoneal signs, and nondistended. BS +. No masses. Truncal obesity    Imaging/Labs: as above     Latest Ref Rng & Units 08/12/2015    2:30 PM 08/09/2015    3:20 PM  CBC  WBC 4.0 - 10.5 K/uL 9.0  7.5   Hemoglobin 12.0 - 15.0 g/dL 16.1  09.6   Hematocrit 36.0 - 46.0 % 46.0  45.5   Platelets 150 - 400 K/uL 316  305    No results found for: "IRON", "TIBC", "FERRITIN"  I personally reviewed and  interpreted the available labs, imaging and endoscopic files.  CT Chest  No evidence of acute abnormality or pulmonary emboli.   5 mm right middle lobe nodule. If the patient is at high risk for bronchogenic carcinoma, follow-up chest CT at 6-12 months is recommended. If the patient is at low risk for bronchogenic carcinoma, follow-up chest CT at 12 months is recommended. This recommendation follows the consensus statement: Guidelines for Management of Small Pulmonary Nodules Detected on CT Scans: A Statement from the Fleischner Society as published in Radiology 2005;237:395-400.   Ultrasound 10 /2024  IMPRESSION:  1.  Hepatomegaly and hepatic steatosis.  2.  Mild dilatation of the common bile duct, measuring 10 mm in diameter. Recommend correlation with biliary serologies.    CTA Chest abdomen and pelvis 07/2023  1. No acute abnormality within the chest, abdomen, and pelvis.  2.  Nonspecific small soft tissue nodular lesion within the right breast measures 10 mm and has a small focus of calcification. Additional tiny calcification within the left breast. Please correlate with dedicated breast imaging.  3.  Indeterminate pulmonary nodules. As per the Fleischner Society guidelines for management of incidentally detected pulmonary nodules, if the patient is not at high risk for lung cancer, no routine follow-up is recommended. If the patient is at high risk for lung cancer, a CT chest in 12 months is recommended. Alternatively, if there is a history of malignancy, follow-up is as per clinical protocol.   Labs from 07/2023 CBC with hemoglobin 15.9 platelet 287 CMP potassium 3.3 normal liver enzymes Hemoglobin A1c 5.7 Impression and Plan:  Joyce Hartman is a 57 y.o. female with essential hypertension, hyperlipidemia, mental health disorder , chronic back pain on opiods who presents for evaluation of Abdominal pain , Unintentional weight loss, Abnormal CT , Screening colonoscopy  with family history of premature colon cancer .  #Upper GI Symptoms: Bloating/Abdominal discomfort  , Early Satiety   Patient complaining of bloating with abdominal discomfort could be acid mediated or H. pylori mediated dyspepsia, SIBO but early satiety with weight loss is considered an alarm symptom and hence upper endoscopy is indicated  Will proceed with diagnostic upper endoscopy with gastric biopsies If negative may need SIBO testing or gastric emptying study  #Common bile duct dilation  Patient had a recent CT abdomen which demonstrated CBD 10 mm which is dilated for her age and without any cholecystectomy.  No cholelithiasis is suggested and liver enzymes are normal  This could be opioid-induced bile duct dilation but given significant dilation and upper GI symptoms we will obtain MRI abdomen MRCP  #Colon cancer screening  Patient never had screening colonoscopy, is elevated risk for colon cancer given mother had colon cancer at age 20  The patient was counseled regarding the importance of colorectal cancer screening, The benefits of screening include early detection of colorectal cancer and precancerous polyps, which can improve treatment outcomes and reduce mortality. Risks associated with screening, particularly colonoscopy, include potential complications such as bleeding and perforation. After deciding different modalities for screening for colon cancer , patient has opted to pursue Colonoscopy   #Breast Nodule  10 mm breast nodule-defer to PCP consider follow-up with mammogram if indicated  #Pulmonary nodule   Pulmonary nodules-will need repeat CT scanning per Fleischner guidelines in 12 months as suggested by recent imaging , patient will follow up with PCP  #Hypertension  The patient was found to have elevated blood pressure when vital signs were checked in the office. The blood pressure was rechecked by the nursing staff and it was found be persistently elevated >140/90  mmHg. I personally advised to the patient to follow up closely with PCP for hypertension control.    All questions were answered.      Joyce Lawman, MD Gastroenterology and Hepatology [facility] Gastroenterology   This chart has been completed using [facility] Dictation software, and while attempts have been made to ensure accuracy , certain words and phrases may not be transcribed as intended

## 2023-09-03 NOTE — Patient Instructions (Signed)
It was very nice to meet you today, as dicussed with will plan for the following :  1) Upper endoscopy and colonoscopy  2) MRI Abdomen MRCP  3)Ensure adequate fluid intake: Aim for 8 glasses of water daily. Follow a high fiber diet: Include foods such as dates, prunes, pears, and kiwi. Use Metamucil twice a day.

## 2023-09-05 NOTE — Telephone Encounter (Signed)
MRI has been approved via insurance. Certiification number [id number]. Valid from 09/03/23-10/21/23. Pt has been scheduled for 11/11/22 at 8 am at [facility]. Pt is to arrive at 7:45 am to register. NPO 4 hours prior. Pt contacted and made aware of appt.    EGD/TCS PA still pending.

## 2023-09-08 ENCOUNTER — Other Ambulatory Visit (INDEPENDENT_AMBULATORY_CARE_PROVIDER_SITE_OTHER): Payer: Self-pay | Admitting: *Deleted

## 2023-09-08 NOTE — Telephone Encounter (Signed)
PA still pending at this time 

## 2023-09-11 NOTE — Telephone Encounter (Signed)
Per Availity; no review required for TCS/EGD

## 2023-09-12 ENCOUNTER — Other Ambulatory Visit (HOSPITAL_COMMUNITY): Payer: Self-pay | Admitting: Gastroenterology

## 2023-09-12 ENCOUNTER — Ambulatory Visit (HOSPITAL_COMMUNITY)
Admission: RE | Admit: 2023-09-12 | Discharge: 2023-09-12 | Disposition: A | Discharge status: Home / Self Care | Payer: Medicaid Other | Source: Ambulatory Visit | Attending: Gastroenterology | Admitting: Gastroenterology

## 2023-09-12 DIAGNOSIS — R1013 Epigastric pain: Secondary | ICD-10-CM | POA: Diagnosis present

## 2023-09-12 DIAGNOSIS — R634 Abnormal weight loss: Secondary | ICD-10-CM | POA: Diagnosis present

## 2023-09-12 DIAGNOSIS — K838 Other specified diseases of biliary tract: Secondary | ICD-10-CM | POA: Insufficient documentation

## 2023-09-12 MED ORDER — GADOBUTROL 1 MMOL/ML IV SOLN
10.0000 mL | Freq: Once | INTRAVENOUS | Status: AC | PRN
  Administered 2023-09-12: 10 mL via INTRAVENOUS

## 2023-09-15 NOTE — Progress Notes (Signed)
Hi Wendy ,  Can you please call the patient and tell the patient the MRI Abdomen again showed bile duct dilations , but did not show any mass or stones   At this time I would recommend proceeding with EGD and Colonoscopy which is already scheduled   After that I may discuss EUS (endoscopic ultrasound) if indicated   Thanks,  Vista Lawman, MD Gastroenterology and Hepatology [facility] Gastroenterology  ===============  May need EUS with Dr Meridee Score if EGD and Colonoscopy are normal as MRI showed persistent Intra and extrahepatic biliary ductal dilatation, common bile duct measuring up to 1.2 cm in caliber.

## 2023-10-06 ENCOUNTER — Ambulatory Visit (HOSPITAL_COMMUNITY): Payer: Medicaid Other | Admitting: Anesthesiology

## 2023-10-06 ENCOUNTER — Encounter (HOSPITAL_COMMUNITY): Payer: Self-pay

## 2023-10-06 ENCOUNTER — Other Ambulatory Visit: Payer: Self-pay

## 2023-10-06 ENCOUNTER — Other Ambulatory Visit (HOSPITAL_COMMUNITY)
Admission: RE | Admit: 2023-10-06 | Discharge: 2023-10-06 | Disposition: A | Discharge status: Home / Self Care | Payer: Medicaid Other | Source: Ambulatory Visit | Attending: Gastroenterology | Admitting: Gastroenterology

## 2023-10-06 ENCOUNTER — Ambulatory Visit (HOSPITAL_COMMUNITY)
Admission: RE | Admit: 2023-10-06 | Discharge: 2023-10-06 | Disposition: A | Discharge status: Home / Self Care | Payer: Medicaid Other | Attending: Gastroenterology | Admitting: Gastroenterology

## 2023-10-06 ENCOUNTER — Encounter (HOSPITAL_COMMUNITY)
Admission: RE | Disposition: A | Discharge status: Home / Self Care | Payer: Self-pay | Source: Home / Self Care | Attending: Gastroenterology

## 2023-10-06 DIAGNOSIS — K3189 Other diseases of stomach and duodenum: Secondary | ICD-10-CM | POA: Diagnosis not present

## 2023-10-06 DIAGNOSIS — R1011 Right upper quadrant pain: Secondary | ICD-10-CM | POA: Diagnosis not present

## 2023-10-06 DIAGNOSIS — K5730 Diverticulosis of large intestine without perforation or abscess without bleeding: Secondary | ICD-10-CM | POA: Insufficient documentation

## 2023-10-06 DIAGNOSIS — K2289 Other specified disease of esophagus: Secondary | ICD-10-CM | POA: Diagnosis not present

## 2023-10-06 DIAGNOSIS — F319 Bipolar disorder, unspecified: Secondary | ICD-10-CM | POA: Insufficient documentation

## 2023-10-06 DIAGNOSIS — I10 Essential (primary) hypertension: Secondary | ICD-10-CM | POA: Insufficient documentation

## 2023-10-06 DIAGNOSIS — R1013 Epigastric pain: Secondary | ICD-10-CM | POA: Insufficient documentation

## 2023-10-06 DIAGNOSIS — Z1211 Encounter for screening for malignant neoplasm of colon: Secondary | ICD-10-CM

## 2023-10-06 DIAGNOSIS — R634 Abnormal weight loss: Secondary | ICD-10-CM | POA: Diagnosis not present

## 2023-10-06 DIAGNOSIS — G8929 Other chronic pain: Secondary | ICD-10-CM | POA: Insufficient documentation

## 2023-10-06 DIAGNOSIS — Z800 Family history of malignant neoplasm of digestive organs: Secondary | ICD-10-CM | POA: Diagnosis not present

## 2023-10-06 DIAGNOSIS — F419 Anxiety disorder, unspecified: Secondary | ICD-10-CM | POA: Diagnosis not present

## 2023-10-06 DIAGNOSIS — K644 Residual hemorrhoidal skin tags: Secondary | ICD-10-CM | POA: Insufficient documentation

## 2023-10-06 DIAGNOSIS — K635 Polyp of colon: Secondary | ICD-10-CM | POA: Insufficient documentation

## 2023-10-06 DIAGNOSIS — D125 Benign neoplasm of sigmoid colon: Secondary | ICD-10-CM | POA: Diagnosis not present

## 2023-10-06 DIAGNOSIS — K319 Disease of stomach and duodenum, unspecified: Secondary | ICD-10-CM | POA: Diagnosis not present

## 2023-10-06 DIAGNOSIS — Z6837 Body mass index (BMI) 37.0-37.9, adult: Secondary | ICD-10-CM | POA: Insufficient documentation

## 2023-10-06 DIAGNOSIS — K648 Other hemorrhoids: Secondary | ICD-10-CM | POA: Diagnosis not present

## 2023-10-06 DIAGNOSIS — K2970 Gastritis, unspecified, without bleeding: Secondary | ICD-10-CM

## 2023-10-06 DIAGNOSIS — F17210 Nicotine dependence, cigarettes, uncomplicated: Secondary | ICD-10-CM | POA: Insufficient documentation

## 2023-10-06 DIAGNOSIS — M549 Dorsalgia, unspecified: Secondary | ICD-10-CM | POA: Diagnosis not present

## 2023-10-06 DIAGNOSIS — R911 Solitary pulmonary nodule: Secondary | ICD-10-CM

## 2023-10-06 DIAGNOSIS — Z79899 Other long term (current) drug therapy: Secondary | ICD-10-CM | POA: Insufficient documentation

## 2023-10-06 HISTORY — PX: POLYPECTOMY: SHX5525

## 2023-10-06 HISTORY — PX: COLONOSCOPY WITH PROPOFOL: SHX5780

## 2023-10-06 HISTORY — PX: BIOPSY: SHX5522

## 2023-10-06 HISTORY — PX: ESOPHAGOGASTRODUODENOSCOPY (EGD) WITH PROPOFOL: SHX5813

## 2023-10-06 LAB — BASIC METABOLIC PANEL
Anion gap: 11 (ref 5–15)
BUN: 5 mg/dL — ABNORMAL LOW (ref 6–20)
CO2: 30 mmol/L (ref 22–32)
Calcium: 9.2 mg/dL (ref 8.9–10.3)
Chloride: 97 mmol/L — ABNORMAL LOW (ref 98–111)
Creatinine, Ser: 0.57 mg/dL (ref 0.44–1.00)
GFR, Estimated: >60 mL/min (ref >60)
Glucose, Bld: 113 mg/dL — ABNORMAL HIGH (ref 70–99)
Potassium: 3.9 mmol/L (ref 3.5–5.1)
Sodium: 138 mmol/L (ref 135–145)

## 2023-10-06 SURGERY — COLONOSCOPY WITH PROPOFOL
Anesthesia: General

## 2023-10-06 MED ORDER — EPHEDRINE SULFATE-NACL 50-0.9 MG/10ML-% IV SOSY
PREFILLED_SYRINGE | INTRAVENOUS | Status: DC | PRN
  Administered 2023-10-06: 10 mg via INTRAVENOUS
  Administered 2023-10-06: 5 mg via INTRAVENOUS

## 2023-10-06 MED ORDER — MIDAZOLAM HCL 2 MG/2ML IJ SOLN
INTRAMUSCULAR | Status: AC
  Filled 2023-10-06: qty 2

## 2023-10-06 MED ORDER — IPRATROPIUM-ALBUTEROL 0.5-2.5 (3) MG/3ML IN SOLN
RESPIRATORY_TRACT | Status: AC
  Filled 2023-10-06: qty 3

## 2023-10-06 MED ORDER — PROPOFOL 10 MG/ML IV BOLUS
INTRAVENOUS | Status: DC | PRN
  Administered 2023-10-06 (×2): 100 mg via INTRAVENOUS

## 2023-10-06 MED ORDER — ONDANSETRON HCL 4 MG/2ML IJ SOLN
INTRAMUSCULAR | Status: DC | PRN
  Administered 2023-10-06: 4 mg via INTRAVENOUS

## 2023-10-06 MED ORDER — DEXAMETHASONE SODIUM PHOSPHATE 4 MG/ML IJ SOLN
INTRAMUSCULAR | Status: AC
  Filled 2023-10-06: qty 1

## 2023-10-06 MED ORDER — PHENYLEPHRINE 80 MCG/ML (10ML) SYRINGE FOR IV PUSH (FOR BLOOD PRESSURE SUPPORT)
PREFILLED_SYRINGE | INTRAVENOUS | Status: AC
  Filled 2023-10-06: qty 10

## 2023-10-06 MED ORDER — SUCCINYLCHOLINE CHLORIDE 200 MG/10ML IV SOSY
PREFILLED_SYRINGE | INTRAVENOUS | Status: AC
  Filled 2023-10-06: qty 10

## 2023-10-06 MED ORDER — LIDOCAINE HCL (PF) 2 % IJ SOLN
INTRAMUSCULAR | Status: AC
  Filled 2023-10-06: qty 5

## 2023-10-06 MED ORDER — MIDAZOLAM HCL 2 MG/2ML IJ SOLN
INTRAMUSCULAR | Status: DC | PRN
  Administered 2023-10-06: 2 mg via INTRAVENOUS

## 2023-10-06 MED ORDER — DEXAMETHASONE SODIUM PHOSPHATE 4 MG/ML IJ SOLN
INTRAMUSCULAR | Status: DC | PRN
  Administered 2023-10-06: 8 mg via INTRAVENOUS

## 2023-10-06 MED ORDER — FENTANYL CITRATE (PF) 100 MCG/2ML IJ SOLN
INTRAMUSCULAR | Status: AC
  Filled 2023-10-06: qty 2

## 2023-10-06 MED ORDER — IPRATROPIUM-ALBUTEROL 0.5-2.5 (3) MG/3ML IN SOLN
3.0000 mL | RESPIRATORY_TRACT | Status: DC
  Administered 2023-10-06: 3 mL via RESPIRATORY_TRACT

## 2023-10-06 MED ORDER — ONDANSETRON HCL 4 MG/2ML IJ SOLN
INTRAMUSCULAR | Status: AC
  Filled 2023-10-06: qty 2

## 2023-10-06 MED ORDER — PROPOFOL 500 MG/50ML IV EMUL
INTRAVENOUS | Status: DC | PRN
  Administered 2023-10-06: 200 ug/kg/min via INTRAVENOUS

## 2023-10-06 MED ORDER — SUCCINYLCHOLINE CHLORIDE 200 MG/10ML IV SOSY
PREFILLED_SYRINGE | INTRAVENOUS | Status: DC | PRN
  Administered 2023-10-06: 160 mg via INTRAVENOUS

## 2023-10-06 MED ORDER — LACTATED RINGERS IV SOLN: INTRAVENOUS | Status: DC

## 2023-10-06 MED ORDER — DEXMEDETOMIDINE HCL IN NACL 80 MCG/20ML IV SOLN
INTRAVENOUS | Status: DC | PRN
  Administered 2023-10-06: 16 ug via INTRAVENOUS

## 2023-10-06 MED ORDER — PHENYLEPHRINE HCL (PRESSORS) 10 MG/ML IV SOLN
INTRAVENOUS | Status: DC | PRN
  Administered 2023-10-06: 80 ug via INTRAVENOUS
  Administered 2023-10-06: 160 ug via INTRAVENOUS

## 2023-10-06 MED ORDER — FENTANYL CITRATE (PF) 100 MCG/2ML IJ SOLN
INTRAMUSCULAR | Status: DC | PRN
  Administered 2023-10-06: 100 ug via INTRAVENOUS

## 2023-10-06 MED ORDER — LIDOCAINE HCL (CARDIAC) PF 100 MG/5ML IV SOSY
PREFILLED_SYRINGE | INTRAVENOUS | Status: DC | PRN
  Administered 2023-10-06: 60 mg via INTRAVENOUS

## 2023-10-06 MED ORDER — EPHEDRINE 5 MG/ML INJ
INTRAVENOUS | Status: AC
  Filled 2023-10-06: qty 5

## 2023-10-06 NOTE — Anesthesia Preprocedure Evaluation (Signed)
Anesthesia Evaluation  Patient identified by MRN, date of birth, ID band Patient awake    Reviewed: Allergy & Precautions, H&P , NPO status , Patient's Chart, lab work & pertinent test results, reviewed documented beta blocker date and time   Airway Mallampati: II  TM Distance: >3 FB Neck ROM: full    Dental no notable dental hx.    Pulmonary neg pulmonary ROS, Current Smoker   Pulmonary exam normal breath sounds clear to auscultation       Cardiovascular Exercise Tolerance: Good hypertension,  Rhythm:regular Rate:Normal     Neuro/Psych  PSYCHIATRIC DISORDERS Anxiety  Bipolar Disorder   negative neurological ROS     GI/Hepatic negative GI ROS, Neg liver ROS,,,  Endo/Other  negative endocrine ROS    Renal/GU negative Renal ROS  negative genitourinary   Musculoskeletal   Abdominal   Peds  Hematology negative hematology ROS (+)   Anesthesia Other Findings   Reproductive/Obstetrics negative OB ROS                             Anesthesia Physical Anesthesia Plan  ASA: 2  Anesthesia Plan: General   Post-op Pain Management:    Induction:   PONV Risk Score and Plan: Propofol infusion  Airway Management Planned:   Additional Equipment:   Intra-op Plan:   Post-operative Plan:   Informed Consent: I have reviewed the patients History and Physical, chart, labs and discussed the procedure including the risks, benefits and alternatives for the proposed anesthesia with the patient or authorized representative who has indicated his/her understanding and acceptance.     Dental Advisory Given  Plan Discussed with: CRNA  Anesthesia Plan Comments:        Anesthesia Quick Evaluation

## 2023-10-06 NOTE — H&P (Signed)
Primary Care Physician:  Rebekah Chesterfield, NP Primary Gastroenterologist:  Dr. Tasia Catchings  Pre-Procedure History & Physical: HPI:   Joyce Hartman is a 57 y.o. female with essential hypertension, hyperlipidemia, mental health disorder , chronic back pain on opiods who presents for evaluation of Abdominal pain , Unintentional weight loss, Abnormal CT , Screening colonoscopy with family history of premature colon cancer .   Patient went to the ER 2 weeks ago for similar complaints.  Reports severe epigastric bloating with early satiety.  Patient reports this has been going on for past couple weeks.  Patient reports losing 19 pounds in 2 weeks unintentionally.  Her bowel movements are between normal hard and liquid but abdominal pain is not related to defecation Never has had colonoscopy. Mother had colon cancer at age 60 and passed away about one month after diagnosis.  The patient denies having any nausea, vomiting, fever, chills, hematochezia, melena, hematemesis, , jaundice, pruritus    Last UJW:JXBJ Last Colonoscopy:none   FHx: Mother colon cancer age 41 Surgical: no abdominal surgeries   Past Medical History:  Diagnosis Date   Anxiety    Back pain    Bipolar 1 disorder (HCC)    High cholesterol    Hypertension    PTSD (post-traumatic stress disorder)     Past Surgical History:  Procedure Laterality Date   ABLATION     BACK SURGERY     TUBAL LIGATION      Prior to Admission medications   Medication Sig Start Date End Date Taking? Authorizing Provider  atorvastatin (LIPITOR) 40 MG tablet Take 40 mg by mouth daily. 01/19/18  Yes [provider]  CAPLYTA 42 MG capsule Take 42 mg by mouth daily.   Yes [provider]  cetirizine (ZYRTEC) 10 MG tablet Take 10 mg by mouth daily.   Yes [provider]  fluticasone (FLONASE) 50 MCG/ACT nasal spray Place 1 spray into both nostrils daily.   Yes [provider]  furosemide (LASIX) 20 MG tablet  Take 20 mg by mouth daily. 08/18/23  Yes [provider]  gabapentin (NEURONTIN) 300 MG capsule Take 300 mg by mouth 2 (two) times daily.   Yes [provider]  HYDROcodone-acetaminophen (NORCO/VICODIN) 5-325 MG tablet Take 1 tablet by mouth every 6 (six) hours as needed. 07/03/15  Yes [provider]  ibuprofen (ADVIL,MOTRIN) 200 MG tablet Take 200 mg by mouth every 6 (six) hours as needed for fever.   Yes [provider]  lisinopril (ZESTRIL) 20 MG tablet Take 20 mg by mouth daily. 01/29/23  Yes [provider]  melatonin 3 MG TABS tablet Take 3 mg by mouth at bedtime. 10/30/14  Yes [provider]  Multiple Vitamin (MULTIVITAMIN) capsule Take 1 capsule by mouth daily.   Yes [provider]  ondansetron (ZOFRAN) 4 MG tablet Take 4 mg by mouth. As needed 10/30/14  Yes [provider]  pantoprazole (PROTONIX) 40 MG tablet Take 40 mg by mouth every morning.   Yes [provider]  psyllium (METAMUCIL SMOOTH TEXTURE) 58.6 % powder Take 1 packet by mouth in the morning and at bedtime. 09/03/23 12/02/23 Yes Treasure Ochs, Juanetta Beets, MD    Allergies as of 09/03/2023 - Review Complete 09/03/2023  Allergen Reaction Noted   Influenza vaccines Swelling 08/09/2015   Penicillins  08/09/2015   Tape Itching 08/09/2015   Tetanus toxoids Swelling 08/09/2015    Family History  Problem Relation Age of Onset   Colon cancer Mother 22  passed at age 23 one month after diagnosis of colon cancer   Thyroid cancer Brother    Diabetes Brother    Breast cancer Paternal Grandmother     Social History   Socioeconomic History   Marital status: Widowed    Spouse name: Not on file   Number of children: Not on file   Years of education: Not on file   Highest education level: Not on file  Occupational History   Not on file  Tobacco Use   Smoking status: Every Day    Types: Cigarettes    Passive exposure: Current   Smokeless tobacco:  Never  Substance and Sexual Activity   Alcohol use: Yes    Comment: occasional   Drug use: No   Sexual activity: Not on file  Other Topics Concern   Not on file  Social History Narrative   Not on file   Social Drivers of Health   Financial Resource Strain: High Risk (03/10/2023)   Received from [facility]   Overall Financial Resource Strain (CARDIA)    Difficulty of Paying Living Expenses: Hard  Food Insecurity: No Food Insecurity (08/07/2023)   Received from [facility]   Hunger Vital Sign    Worried About Running Out of Food in the Last Year: Never true    Ran Out of Food in the Last Year: Never true  Transportation Needs: No Transportation Needs (08/07/2023)   Received from [facility] - Transportation    Lack of Transportation (Medical): No    Lack of Transportation (Non-Medical): No  Physical Activity: Unknown (03/10/2023)   Received from [facility]   Exercise Vital Sign    Days of Exercise per Week: Patient declined    Minutes of Exercise per Session: 0 min  Stress: No Stress Concern Present (08/07/2023)   Received from [facility] - Occupational Stress Questionnaire    Feeling of Stress : Only a little  Social Connections: Somewhat Isolated (03/10/2023)   Received from [facility]   Social Network    How would you rate your social network (family, work, friends)?: Restricted participation with some degree of social isolation  Intimate Partner Violence: Not At Risk (08/06/2023)   Received from [facility]   HITS    Over the last 12 months how often did your partner physically hurt you?: Never    Over the last 12 months how often did your partner insult you or talk down to you?: Never    Over the last 12 months how often did your partner threaten you with physical harm?: Never    Over the last 12 months how often did your partner scream or curse at you?: Never    Review of Systems: See HPI,  otherwise negative ROS  Physical Exam: Vital signs in last 24 hours: Temp:  [98.5 F (36.9 C)] 98.5 F (36.9 C) (12/16 1136) Pulse Rate:  [87] 87 (12/16 1136) Resp:  [12] 12 (12/16 1136) BP: (179)/(110) 179/110 (12/16 1136) SpO2:  [94 %] 94 % (12/16 1136) Weight:  [99.8 kg] 99.8 kg (12/16 1136)   General:   Alert,  Well-developed, well-nourished, pleasant and cooperative in NAD Head:  Normocephalic and atraumatic. Eyes:  Sclera clear, no icterus.   Conjunctiva pink. Ears:  Normal auditory acuity. Nose:  No deformity, discharge,  or lesions. Msk:  Symmetrical without gross deformities. Normal posture. Extremities:  Without clubbing or edema. Neurologic:  Alert and  oriented x4;  grossly normal neurologically. Skin:  Intact without significant lesions or rashes. Psych:  Alert and cooperative. Normal mood and affect.  Impression/Plan: Sausha Zappia is here for a EGD/colonoscopy for unintentional weight loss and screening colonoscopy given family history of colon cancer.  The risks of the procedure including infection, bleed, or perforation as well as benefits, limitations, alternatives and imponderables have been reviewed with the patient. Questions have been answered. All parties agreeable.

## 2023-10-06 NOTE — Discharge Instructions (Addendum)

## 2023-10-06 NOTE — Op Note (Addendum)
[facility] Patient Name: Joyce Hartman Procedure Date: 10/06/2023 11:54 AM MRN: [id number] Date of Birth: 11/18/1965 Attending MD: Sanjuan Dame , MD, [id number] CSN: [id number] Age: 57 Admit Type: Outpatient Procedure:                Upper GI endoscopy Indications:              Epigastric abdominal pain, Abdominal pain in the                            right upper quadrant, Dyspepsia, Weight loss Providers:                Sanjuan Dame, MD, Edrick Kins, RN, Elinor Parkinson Referring MD:              Medicines:                Monitored Anesthesia Care Complications:            Bronchospasm, treated with intubation. Some self                            resolving bleeding encountered during intubation Estimated Blood Loss:     Estimated blood loss was minimal. Procedure:                Pre-Anesthesia Assessment:                           - Prior to the procedure, a History and Physical                            was performed, and patient medications and                            allergies were reviewed. The patient's tolerance of                            previous anesthesia was also reviewed. The risks                            and benefits of the procedure and the sedation                            options and risks were discussed with the patient.                            All questions were answered, and informed consent                            was obtained. Prior Anticoagulants: The patient has                            taken no anticoagulant or antiplatelet agents  except for NSAID medication. ASA Grade Assessment:                            III - A patient with severe systemic disease. After                            reviewing the risks and benefits, the patient was                            deemed in satisfactory condition to undergo the                            procedure.                            After obtaining informed consent, the endoscope was                            passed under direct vision. Throughout the                            procedure, the patient's blood pressure, pulse, and                            oxygen saturations were monitored continuously. The                            [id number]-H190 ([id number]) scope was introduced through the                            mouth, and advanced to the second part of duodenum.                            The upper GI endoscopy was technically difficult                            and complex due to the patient's oxygen                            desaturation. Successful completion of the                            procedure was aided by treating with ventilation.                            The patient tolerated the procedure well. Scope In: 12:50:36 PM Scope Out: 1:08:58 PM Total Procedure Duration: 0 hours 18 minutes 22 seconds  Findings:      Patchy mucosal variance characterized by discoloration was found in the       proximal esophagus.      Mildly erythematous mucosa without bleeding was found in the stomach.       Biopsies were taken with a cold forceps for histology.      The duodenal bulb and second portion of the duodenum were normal. Impression:               -  Esophageal mucosal variant likely inlet patch                           - Erythematous mucosa in the stomach. Biopsied.                           - Normal duodenal bulb and second portion of the                            duodenum. Moderate Sedation:      Per Anesthesia Care Recommendation:           - Patient has a contact number available for                            emergencies. The signs and symptoms of potential                            delayed complications were discussed with the                            patient. Return to normal activities tomorrow.                            Written discharge instructions were provided to the                             patient.                           - Resume previous diet.                           - Continue present medications.                           - Await pathology results.                           -Recommend future endoscopic evaluation to be done                            under endotracheal intubation given hypoxic event                            encountered during EGD .                           -Refer for EUS given persistent Intra-extrahepatic                            biliary dilation seen on MRI and RUQ pain . If                            unremarkable EUS may recommend evaluation for  cholecystectomy given persistent RUQ pain                           -Will refer the patient to be seen by pulmonology                            given patient appears to have undiagnosed                            obstructive lung disease Procedure Code(s):        --- Professional ---                           [phone], Esophagogastroduodenoscopy, flexible,                            transoral; with biopsy, single or multiple Diagnosis Code(s):        --- Professional ---                           K22.89, Other specified disease of esophagus                           K31.89, Other diseases of stomach and duodenum                           R10.13, Epigastric pain                           R10.11, Right upper quadrant pain                           R63.4, Abnormal weight loss CPT copyright 2022 American Medical Association. All rights reserved. The codes documented in this report are preliminary and upon coder review may  be revised to meet current compliance requirements. Sanjuan Dame, MD Sanjuan Dame, MD 10/06/2023 1:47:15 PM This report has been signed electronically. Number of Addenda: 0

## 2023-10-06 NOTE — Anesthesia Procedure Notes (Signed)
Procedure Name: Intubation Date/Time: 10/06/2023 1:01 PM  Performed by: Shanon Payor, CRNAPre-anesthesia Checklist: Patient identified, Emergency Drugs available, Suction available, Patient being monitored and Timeout performed Patient Re-evaluated:Patient Re-evaluated prior to induction Oxygen Delivery Method: Circle system utilized Preoxygenation: Pre-oxygenation with 100% oxygen Induction Type: IV induction, Rapid sequence and Cricoid Pressure applied Laryngoscope Size: Mac and 3 Grade View: Grade I Tube type: Oral Tube size: 7.0 mm Number of attempts: 1 Airway Equipment and Method: Stylet Secured at: 22 cm Tube secured with: Tape Dental Injury: Teeth and Oropharynx as per pre-operative assessment

## 2023-10-06 NOTE — Transfer of Care (Signed)
Immediate Anesthesia Transfer of Care Note  Patient: Joyce Hartman  Procedure(s) Performed: COLONOSCOPY WITH PROPOFOL ESOPHAGOGASTRODUODENOSCOPY (EGD) WITH PROPOFOL BIOPSY POLYPECTOMY  Patient Location: PACU  Anesthesia Type:General  Level of Consciousness: awake, alert , oriented, and patient cooperative  Airway & Oxygen Therapy: Patient Spontanous Breathing and Patient connected to face mask oxygen  Post-op Assessment: Report given to RN, Post -op Vital signs reviewed and stable, and Patient moving all extremities X 4  Post vital signs: Reviewed and stable  Last Vitals:  Vitals Value Taken Time  BP 154/92 10/06/23 1400  Temp 36.3 C 10/06/23 1400  Pulse 98 10/06/23 1411  Resp 11 10/06/23 1411  SpO2 96 % 10/06/23 1411  Vitals shown include unfiled device data.  Last Pain:  Vitals:   10/06/23 1400  TempSrc:   PainSc: 0-No pain      Patients Stated Pain Goal: 7 (10/06/23 1136)  Complications: No notable events documented.

## 2023-10-06 NOTE — Op Note (Signed)
[facility] Patient Name: Joyce Hartman Procedure Date: 10/06/2023 11:53 AM MRN: [id number] Date of Birth: 02-Apr-1966 Attending MD: Sanjuan Dame , MD, [id number] CSN: [id number] Age: 57 Admit Type: Outpatient Procedure:                Colonoscopy Indications:              Screening in patient at increased risk: Family                            history of 1st-degree relative with colorectal                            cancer before age 6 years Providers:                Sanjuan Dame, MD, Edrick Kins, RN, Elinor Parkinson Referring MD:              Medicines:                Monitored Anesthesia Care Complications:            No immediate complications. Estimated Blood Loss:     Estimated blood loss: none. Procedure:                Pre-Anesthesia Assessment:                           - Prior to the procedure, a History and Physical                            was performed, and patient medications and                            allergies were reviewed. The patient's tolerance of                            previous anesthesia was also reviewed. The risks                            and benefits of the procedure and the sedation                            options and risks were discussed with the patient.                            All questions were answered, and informed consent                            was obtained. Prior Anticoagulants: The patient has                            taken no anticoagulant or antiplatelet agents                            except for  NSAID medication. ASA Grade Assessment:                            III - A patient with severe systemic disease. After                            reviewing the risks and benefits, the patient was                            deemed in satisfactory condition to undergo the                            procedure.                           After obtaining informed consent, the colonoscope                             was passed under direct vision. Throughout the                            procedure, the patient's blood pressure, pulse, and                            oxygen saturations were monitored continuously. The                            [id number]) scope was introduced through the                            anus and advanced to the the cecum, identified by                            appendiceal orifice and ileocecal valve. The                            colonoscopy was performed without difficulty. The                            patient tolerated the procedure well. The quality                            of the bowel preparation was evaluated using the                            BBPS [address] [employer] Bowel Preparation Scale) with scores                            of: Right Colon = 3, Transverse Colon = 3 and Left                            Colon = 3 (entire mucosa seen well with no residual  staining, small fragments of stool or opaque                            liquid). The total BBPS score equals 9. The                            ileocecal valve, appendiceal orifice, and rectum                            were photographed. Scope In: 1:13:56 PM Scope Out: 1:38:39 PM Scope Withdrawal Time: 0 hours 19 minutes 0 seconds  Total Procedure Duration: 0 hours 24 minutes 43 seconds  Findings:      The perianal and digital rectal examinations were normal.      Five sessile polyps were found in the sigmoid colon. The polyps were 3       to 6 mm in size. These polyps were removed with a cold snare. Resection       and retrieval were complete.      A few small-mouthed diverticula were found in the left colon.      Non-bleeding external and internal hemorrhoids were found during       retroflexion. The hemorrhoids were small. Impression:               - Five 3 to 6 mm polyps in the sigmoid colon,                            removed with a cold snare.  Resected and retrieved.                           - Diverticulosis in the left colon.                           - Non-bleeding external and internal hemorrhoids. Moderate Sedation:      Per Anesthesia Care Recommendation:           - Patient has a contact number available for                            emergencies. The signs and symptoms of potential                            delayed complications were discussed with the                            patient. Return to normal activities tomorrow.                            Written discharge instructions were provided to the                            patient.                           - Resume previous diet.                           -  Continue present medications.                           - Await pathology results.                           - Repeat colonoscopy in 5 years for surveillance                            based on pathology results.                           - Return to GI clinic as previously scheduled. Procedure Code(s):        --- Professional ---                           [phone], Colonoscopy, flexible; with removal of                            tumor(s), polyp(s), or other lesion(s) by snare                            technique Diagnosis Code(s):        --- Professional ---                           Z80.0, Family history of malignant neoplasm of                            digestive organs                           D12.5, Benign neoplasm of sigmoid colon                           K64.8, Other hemorrhoids                           K57.30, Diverticulosis of large intestine without                            perforation or abscess without bleeding CPT copyright 2022 American Medical Association. All rights reserved. The codes documented in this report are preliminary and upon coder review may  be revised to meet current compliance requirements. Sanjuan Dame, MD Sanjuan Dame, MD 10/06/2023 1:49:58 PM This report has been  signed electronically. Number of Addenda: 0

## 2023-10-07 ENCOUNTER — Telehealth: Payer: Self-pay

## 2023-10-07 LAB — SURGICAL PATHOLOGY

## 2023-10-07 NOTE — Telephone Encounter (Signed)
----- Message from Lemar Lofty sent at 10/07/2023  5:53 AM EST ----- Regarding: RE: CBD dilation Sherryann Frese, Please schedule this patient for upper EUS related bile duct evaluation. Can be scheduled anytime point in the next 3 months. Thanks. GM ----- Message ----- From: Franky Macho, MD Sent: 10/06/2023   1:53 PM EST To: Lemar Lofty., MD Subject: RE: CBD dilation                               Hi Dr Meridee Score   I just did a EGD/Colonoscopy on this patient . She has unintentional weight loss, persistent RUQ pain and MRI with persistent intra-extra hepatic biliary dilation .  She is agreeable now for EUS. Do you think you can get her in ?   FYI: recommend performing this procedure under ETT as she had hypoxic event during EGD ----- Message ----- From: Lemar Lofty., MD Sent: 09/15/2023   4:27 PM EST To: Franky Macho, MD Subject: RE: CBD dilation                               MFA, If patient is agreeable after you discuss, since it will take time, you can just let me know before E/C if you like, so she can get on list. But wh you would like to do is fine. GMatever ----- Message ----- From: Franky Macho, MD Sent: 09/15/2023   3:22 PM EST To: Lemar Lofty., MD Subject: RE: CBD dilation                               Thank you for your helpful reply !  Ok makes sense , I will reach out to you and Editor, commissioning after EGD/Colonoscopy and once I discuss with the patient ----- Message ----- From: Lemar Lofty., MD Sent: 09/15/2023   3:12 PM EST To: Franky Macho, MD Subject: RE: CBD dilation                               MFA, I think she has indication for EUS. Would begin your workup as you are doing with E/C though. No ERCP unless she has abnormality in the ampulla or stone in the duct. Let me know what you would like to do (will be Jan/Feb before procedures however). GM ----- Message ----- From: Franky Macho, MD Sent: 09/15/2023   9:23 AM EST To: Lemar Lofty., MD Subject: CBD dilation                                   Hi dr Mansouraty   Need your advice on this patient:   57 y.o. female with , chronic back pain on opiods I evaluated her for  Abdominal pain , Unintentional weight loss, Family history of CRC   Reports severe epigastric bloating with early satiety.  Patient reports this has been going on for past couple weeks.  Has family history of premature CRC  Normal liver enzymes and T.Bili   CT was previously done with CBD dilation . MRCP with Intra and extrahepatic biliary ductal dilatation, common bile duct measuring up to 1.2 cm in caliber.  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 I will be doing bidirectional endoscopy for weight loss, early satiety and CRC screening.   Is there any indication for ERCP ?

## 2023-10-10 ENCOUNTER — Other Ambulatory Visit: Payer: Self-pay

## 2023-10-10 ENCOUNTER — Encounter (HOSPITAL_COMMUNITY): Payer: Self-pay | Admitting: Gastroenterology

## 2023-10-10 DIAGNOSIS — K838 Other specified diseases of biliary tract: Secondary | ICD-10-CM

## 2023-10-10 DIAGNOSIS — K839 Disease of biliary tract, unspecified: Secondary | ICD-10-CM | POA: Insufficient documentation

## 2023-10-10 NOTE — Telephone Encounter (Signed)
EUS scheduled, pt instructed and medications reviewed.  Patient instructions mailed to home.  Patient to call with any questions or concerns.  

## 2023-10-10 NOTE — Telephone Encounter (Signed)
EUS has been set up for 01/01/24 at 8 am at [facility] with GM   Left message on machine to call back

## 2023-10-10 NOTE — Anesthesia Postprocedure Evaluation (Signed)
Anesthesia Post Note  Patient: Joyce Hartman  Procedure(s) Performed: COLONOSCOPY WITH PROPOFOL ESOPHAGOGASTRODUODENOSCOPY (EGD) WITH PROPOFOL BIOPSY POLYPECTOMY  Patient location during evaluation: Phase II Anesthesia Type: General Level of consciousness: awake Pain management: pain level controlled Vital Signs Assessment: post-procedure vital signs reviewed and stable Respiratory status: spontaneous breathing and respiratory function stable Cardiovascular status: blood pressure returned to baseline and stable Postop Assessment: no headache and no apparent nausea or vomiting Anesthetic complications: no Comments: Late entry   No notable events documented.   Last Vitals:  Vitals:   10/06/23 1416 10/06/23 1423  BP: (!) 155/100 (!) 160/90  Pulse: 97 (!) 101  Resp: 17 20  Temp:  (!) 36.3 C  SpO2: 97% 93%    Last Pain:  Vitals:   10/06/23 1416  TempSrc:   PainSc: 0-No pain                 Windell Norfolk

## 2023-10-13 ENCOUNTER — Encounter (INDEPENDENT_AMBULATORY_CARE_PROVIDER_SITE_OTHER): Payer: Self-pay | Admitting: *Deleted

## 2023-10-13 NOTE — Progress Notes (Signed)
I reviewed the pathology results. Ann, can you send her a letter with the findings as described below please?  Repeat colonoscopy in 5 years  Thanks,  Vista Lawman, MD Gastroenterology and Hepatology [facility]  ---------------------------------------------------------------------------------------------  [facility] [address], [place], [place] [address] Phone:  [phone]   10/13/23 [name], [place]   Dear Joyce Hartman,  I am writing to inform you that the biopsies taken during your recent endoscopic examination showed:  No H. Pylori bacteria in stomach , or any early cancer changes to the stomach mucosa ( Intestinal metaplasia)   I am writing to let you know the results of your recent colonoscopy.  You had a total of 5 polyps removed. The pathology came back as "hyperplastic polyp." These findings are NOT cancer and do not turn into cancer.  Given these findings, it is recommended that your next colonoscopy be performed in 5 years given family history of colon polyp.  Also I have referred you to Dr Meridee Score for EUS given persistent Intra- extrahepatic biliary dilation seen on MRI   Also I value your feedback , so if you get a survey , please take the time to fill it out and thank you for choosing [facility]  Please call us at [phone] if you have persistent problems or have questions about your condition that have not been fully answered at this time.  Sincerely,  Vista Lawman, MD Gastroenterology and Hepatology

## 2023-11-02 NOTE — Progress Notes (Deleted)
 Joyce Hartman, female    DOB: [birth year]    MRN: [id number]   Brief patient profile:  ***  yo*** *** referred to pulmonary clinic in [place]  11/04/2023 by *** for ***      History of Present Illness  11/04/2023  Pulmonary/ 1st office eval/ [place] [facility] [place] [facility]  No chief complaint on file.    Dyspnea:  *** Cough: *** Sleep: *** SABA use: *** 02: *** LDSCT:***  No obvious day to day or daytime pattern/variability or assoc excess/ purulent sputum or mucus plugs or hemoptysis or cp or chest tightness, subjective wheeze or overt sinus or hb symptoms.    Also denies any obvious fluctuation of symptoms with weather or environmental changes or other aggravating or alleviating factors except as outlined above   No unusual exposure hx or h/o childhood pna/ asthma or knowledge of premature birth.  Current Allergies, Complete Past Medical History, Past Surgical History, Family History, and Social History were reviewed in [employer] record.  ROS  The following are not active complaints unless bolded Hoarseness, sore throat, dysphagia, dental problems, itching, sneezing,  nasal congestion or discharge of excess mucus or purulent secretions, ear ache,   fever, chills, sweats, unintended wt loss or wt gain, classically pleuritic or exertional cp,  orthopnea pnd or arm/hand swelling  or leg swelling, presyncope, palpitations, abdominal pain, anorexia, nausea, vomiting, diarrhea  or change in bowel habits or change in bladder habits, change in stools or change in urine, dysuria, hematuria,  rash, arthralgias, visual complaints, headache, numbness, weakness or ataxia or problems with walking or coordination,  change in mood or  memory.            Outpatient Medications Prior to Visit  Medication Sig Dispense Refill   atorvastatin (LIPITOR) 40 MG tablet Take 40 mg by mouth daily.     CAPLYTA 42 MG capsule Take 42 mg by mouth daily.     cetirizine (ZYRTEC)  10 MG tablet Take 10 mg by mouth daily.     fluticasone (FLONASE) 50 MCG/ACT nasal spray Place 1 spray into both nostrils daily.     furosemide (LASIX) 20 MG tablet Take 20 mg by mouth daily.     gabapentin (NEURONTIN) 300 MG capsule Take 300 mg by mouth 2 (two) times daily.     HYDROcodone-acetaminophen (NORCO/VICODIN) 5-325 MG tablet Take 1 tablet by mouth every 6 (six) hours as needed.     ibuprofen (ADVIL,MOTRIN) 200 MG tablet Take 200 mg by mouth every 6 (six) hours as needed for fever.     lisinopril (ZESTRIL) 20 MG tablet Take 20 mg by mouth daily.     melatonin 3 MG TABS tablet Take 3 mg by mouth at bedtime.     Multiple Vitamin (MULTIVITAMIN) capsule Take 1 capsule by mouth daily.     ondansetron  (ZOFRAN ) 4 MG tablet Take 4 mg by mouth. As needed     pantoprazole (PROTONIX) 40 MG tablet Take 40 mg by mouth every morning.     psyllium (METAMUCIL SMOOTH TEXTURE) 58.6 % powder Take 1 packet by mouth in the morning and at bedtime. 60 packet 2   No facility-administered medications prior to visit.    Past Medical History:  Diagnosis Date   Anxiety    Back pain    Bipolar 1 disorder (HCC)    High cholesterol    Hypertension    PTSD (post-traumatic stress disorder)       Objective:  There were no vitals taken for this visit.         Assessment   No problem-specific Assessment & Plan notes found for this encounter.     Ozell America, MD 11/02/2023

## 2023-11-04 ENCOUNTER — Institutional Professional Consult (permissible substitution): Payer: Medicaid Other | Admitting: Internal Medicine

## 2023-11-26 ENCOUNTER — Institutional Professional Consult (permissible substitution): Payer: Medicaid Other | Admitting: Emergency Medicine

## 2023-12-03 NOTE — Progress Notes (Deleted)
 Joyce Hartman, female    DOB: 14-Nov-1965    MRN: [id number]   Brief patient profile:  ***  yo*** *** referred to pulmonary clinic in [place]  12/04/2023 by *** for ***      History of Present Illness  12/04/2023  Pulmonary/ 1st office eval/ Sherene Sires / [name]  No chief complaint on file.    Dyspnea:  *** Cough: *** Sleep: *** SABA use: *** 02: *** LDSCT:***  No obvious day to day or daytime pattern/variability or assoc excess/ purulent sputum or mucus plugs or hemoptysis or cp or chest tightness, subjective wheeze or overt sinus or hb symptoms.    Also denies any obvious fluctuation of symptoms with weather or environmental changes or other aggravating or alleviating factors except as outlined above   No unusual exposure hx or h/o childhood pna/ asthma or knowledge of premature birth.  Current Allergies, Complete Past Medical History, Past Surgical History, Family History, and Social History were reviewed in [employer] record.  ROS  The following are not active complaints unless bolded Hoarseness, sore throat, dysphagia, dental problems, itching, sneezing,  nasal congestion or discharge of excess mucus or purulent secretions, ear ache,   fever, chills, sweats, unintended wt loss or wt gain, classically pleuritic or exertional cp,  orthopnea pnd or arm/hand swelling  or leg swelling, presyncope, palpitations, abdominal pain, anorexia, nausea, vomiting, diarrhea  or change in bowel habits or change in bladder habits, change in stools or change in urine, dysuria, hematuria,  rash, arthralgias, visual complaints, headache, numbness, weakness or ataxia or problems with walking or coordination,  change in mood or  memory.            Outpatient Medications Prior to Visit  Medication Sig Dispense Refill   atorvastatin (LIPITOR) 40 MG tablet Take 40 mg by mouth daily.     CAPLYTA 42 MG capsule Take 42 mg by mouth daily.     cetirizine (ZYRTEC)  10 MG tablet Take 10 mg by mouth daily.     fluticasone (FLONASE) 50 MCG/ACT nasal spray Place 1 spray into both nostrils daily.     furosemide (LASIX) 20 MG tablet Take 20 mg by mouth daily.     gabapentin (NEURONTIN) 300 MG capsule Take 300 mg by mouth 2 (two) times daily.     HYDROcodone-acetaminophen (NORCO/VICODIN) 5-325 MG tablet Take 1 tablet by mouth every 6 (six) hours as needed.     ibuprofen (ADVIL,MOTRIN) 200 MG tablet Take 200 mg by mouth every 6 (six) hours as needed for fever.     lisinopril (ZESTRIL) 20 MG tablet Take 20 mg by mouth daily.     melatonin 3 MG TABS tablet Take 3 mg by mouth at bedtime.     Multiple Vitamin (MULTIVITAMIN) capsule Take 1 capsule by mouth daily.     ondansetron (ZOFRAN) 4 MG tablet Take 4 mg by mouth. As needed     pantoprazole (PROTONIX) 40 MG tablet Take 40 mg by mouth every morning.     No facility-administered medications prior to visit.    Past Medical History:  Diagnosis Date   Anxiety    Back pain    Bipolar 1 disorder (HCC)    High cholesterol    Hypertension    PTSD (post-traumatic stress disorder)       Objective:     There were no vitals taken for this visit.         Assessment   No problem-specific Assessment &  Plan notes found for this encounter.     Sandrea Hughs, MD 12/03/2023

## 2023-12-04 ENCOUNTER — Institutional Professional Consult (permissible substitution): Payer: Medicaid Other | Admitting: Internal Medicine

## 2023-12-23 ENCOUNTER — Encounter (HOSPITAL_COMMUNITY): Payer: Self-pay | Admitting: Gastroenterology

## 2023-12-24 ENCOUNTER — Telehealth: Payer: Self-pay

## 2023-12-24 ENCOUNTER — Encounter (HOSPITAL_COMMUNITY): Payer: Self-pay | Admitting: Gastroenterology

## 2023-12-24 NOTE — Telephone Encounter (Signed)
 Procedure: Procedure date:  Procedure location:  Arrival Time:  Spoke with the patient Y/N: n Any prep concerns?   Has the patient obtained the prep from the pharmacy ?  Do you have a care partner and transportation:  Any additional concerns?  Vm  Second vm 12/25/23

## 2023-12-24 NOTE — Progress Notes (Signed)
 Attempted to obtain medical history for pre op call via telephone, unable to reach at this time. HIPAA compliant voicemail message left requesting return call to pre surgical testing department.

## 2023-12-30 ENCOUNTER — Telehealth: Payer: Self-pay | Admitting: Gastroenterology

## 2023-12-30 NOTE — Telephone Encounter (Signed)
 I have spoken to the pt and moved her appt to 02/23/24 at 8 am with GM at [facility]. She has been re instructed and new instructions sent to My Chart.

## 2023-12-30 NOTE — Telephone Encounter (Signed)
 Patient called and stated that she needs to cancel her procedure due to her having a abscessed tooth right now. Patient is wanting to reschedule her procedure. Patient is requesting a call back. Please advise.

## 2024-01-11 NOTE — Progress Notes (Deleted)
 Joyce Hartman, female    DOB: [birth year]    MRN: [id number]   Brief patient profile:  58  yo*** *** referred to pulmonary clinic in [place]  01/15/2024 by *** for ***   Not prev seen    History of Present Illness  01/15/2024  Pulmonary/ 1st office eval/ Sherene Sires / [name]  No chief complaint on file.    Dyspnea:  *** Cough: *** Sleep: *** SABA use: *** 02: *** LDSCT:***  No obvious day to day or daytime pattern/variability or assoc excess/ purulent sputum or mucus plugs or hemoptysis or cp or chest tightness, subjective wheeze or overt sinus or hb symptoms.    Also denies any obvious fluctuation of symptoms with weather or environmental changes or other aggravating or alleviating factors except as outlined above   No unusual exposure hx or h/o childhood pna/ asthma or knowledge of premature birth.  Current Allergies, Complete Past Medical History, Past Surgical History, Family History, and Social History were reviewed in [employer] record.  ROS  The following are not active complaints unless bolded Hoarseness, sore throat, dysphagia, dental problems, itching, sneezing,  nasal congestion or discharge of excess mucus or purulent secretions, ear ache,   fever, chills, sweats, unintended wt loss or wt gain, classically pleuritic or exertional cp,  orthopnea pnd or arm/hand swelling  or leg swelling, presyncope, palpitations, abdominal pain, anorexia, nausea, vomiting, diarrhea  or change in bowel habits or change in bladder habits, change in stools or change in urine, dysuria, hematuria,  rash, arthralgias, visual complaints, headache, numbness, weakness or ataxia or problems with walking or coordination,  change in mood or  memory.            Outpatient Medications Prior to Visit  Medication Sig Dispense Refill   atorvastatin (LIPITOR) 40 MG tablet Take 40 mg by mouth daily.     CAPLYTA 42 MG capsule Take 42 mg by mouth daily.      cetirizine (ZYRTEC) 10 MG tablet Take 10 mg by mouth daily.     fluticasone (FLONASE) 50 MCG/ACT nasal spray Place 1 spray into both nostrils daily.     furosemide (LASIX) 20 MG tablet Take 20 mg by mouth daily.     gabapentin (NEURONTIN) 300 MG capsule Take 300 mg by mouth 2 (two) times daily.     HYDROcodone-acetaminophen (NORCO/VICODIN) 5-325 MG tablet Take 1 tablet by mouth every 6 (six) hours as needed.     ibuprofen (ADVIL,MOTRIN) 200 MG tablet Take 200 mg by mouth every 6 (six) hours as needed for fever.     lisinopril (ZESTRIL) 20 MG tablet Take 20 mg by mouth daily.     melatonin 3 MG TABS tablet Take 3 mg by mouth at bedtime.     Multiple Vitamin (MULTIVITAMIN) capsule Take 1 capsule by mouth daily.     ondansetron (ZOFRAN) 4 MG tablet Take 4 mg by mouth. As needed     pantoprazole (PROTONIX) 40 MG tablet Take 40 mg by mouth every morning.     No facility-administered medications prior to visit.    Past Medical History:  Diagnosis Date   Anxiety    Back pain    Bipolar 1 disorder (HCC)    High cholesterol    Hypertension    PTSD (post-traumatic stress disorder)       Objective:     There were no vitals taken for this visit.         Assessment   No  problem-specific Assessment & Plan notes found for this encounter.     Sandrea Hughs, MD 01/11/2024

## 2024-01-15 ENCOUNTER — Ambulatory Visit: Payer: Medicaid Other | Admitting: Internal Medicine

## 2024-01-25 NOTE — Progress Notes (Deleted)
 Joyce Hartman, female    DOB: 1966/04/30    MRN: [id number]   Brief patient profile:  58  yo*** *** referred to pulmonary clinic in [place]  01/28/2024 by *** for ***   Not prev seen    History of Present Illness  01/28/2024  Pulmonary/ 1st office eval/ Sherene Sires / [name]  No chief complaint on file.    Dyspnea:  *** Cough: *** Sleep: *** SABA use: *** 02: *** LDSCT:***  No obvious day to day or daytime pattern/variability or assoc excess/ purulent sputum or mucus plugs or hemoptysis or cp or chest tightness, subjective wheeze or overt sinus or hb symptoms.    Also denies any obvious fluctuation of symptoms with weather or environmental changes or other aggravating or alleviating factors except as outlined above   No unusual exposure hx or h/o childhood pna/ asthma or knowledge of premature birth.  Current Allergies, Complete Past Medical History, Past Surgical History, Family History, and Social History were reviewed in [employer] record.  ROS  The following are not active complaints unless bolded Hoarseness, sore throat, dysphagia, dental problems, itching, sneezing,  nasal congestion or discharge of excess mucus or purulent secretions, ear ache,   fever, chills, sweats, unintended wt loss or wt gain, classically pleuritic or exertional cp,  orthopnea pnd or arm/hand swelling  or leg swelling, presyncope, palpitations, abdominal pain, anorexia, nausea, vomiting, diarrhea  or change in bowel habits or change in bladder habits, change in stools or change in urine, dysuria, hematuria,  rash, arthralgias, visual complaints, headache, numbness, weakness or ataxia or problems with walking or coordination,  change in mood or  memory.            Outpatient Medications Prior to Visit  Medication Sig Dispense Refill   atorvastatin (LIPITOR) 40 MG tablet Take 40 mg by mouth daily.     CAPLYTA 42 MG capsule Take 42 mg by mouth daily.     cetirizine  (ZYRTEC) 10 MG tablet Take 10 mg by mouth daily.     fluticasone (FLONASE) 50 MCG/ACT nasal spray Place 1 spray into both nostrils daily.     furosemide (LASIX) 20 MG tablet Take 20 mg by mouth daily.     gabapentin (NEURONTIN) 300 MG capsule Take 300 mg by mouth 2 (two) times daily.     HYDROcodone-acetaminophen (NORCO/VICODIN) 5-325 MG tablet Take 1 tablet by mouth every 6 (six) hours as needed.     ibuprofen (ADVIL,MOTRIN) 200 MG tablet Take 200 mg by mouth every 6 (six) hours as needed for fever.     lisinopril (ZESTRIL) 20 MG tablet Take 20 mg by mouth daily.     melatonin 3 MG TABS tablet Take 3 mg by mouth at bedtime.     Multiple Vitamin (MULTIVITAMIN) capsule Take 1 capsule by mouth daily.     ondansetron (ZOFRAN) 4 MG tablet Take 4 mg by mouth. As needed     pantoprazole (PROTONIX) 40 MG tablet Take 40 mg by mouth every morning.     No facility-administered medications prior to visit.    Past Medical History:  Diagnosis Date   Anxiety    Back pain    Bipolar 1 disorder (HCC)    High cholesterol    Hypertension    PTSD (post-traumatic stress disorder)       Objective:     There were no vitals taken for this visit.         Assessment   No  problem-specific Assessment & Plan notes found for this encounter.     Sandrea Hughs, MD 01/25/2024

## 2024-01-28 ENCOUNTER — Ambulatory Visit: Admitting: Internal Medicine

## 2024-02-16 ENCOUNTER — Telehealth: Payer: Self-pay

## 2024-02-16 ENCOUNTER — Encounter (HOSPITAL_COMMUNITY): Payer: Self-pay | Admitting: Gastroenterology

## 2024-02-16 NOTE — Progress Notes (Signed)
 Attempted to obtain medical history for pre op call via telephone, unable to reach at this time. HIPAA compliant voicemail message left requesting return call to pre surgical testing department.

## 2024-02-16 NOTE — Telephone Encounter (Signed)
 Procedure:upper endo Procedure date: 02/23/24 Procedure location: wl Arrival Time: 630am Spoke with the patient Y/N: I left a detail message to call back to confirm appt.@4 :01 pm 02/16/24. I call pt left detail message [phone] on 4/30 Any prep concerns? n  Has the patient obtained the prep from the pharmacy ? n Do you have a care partner and transportation: . Any additional concerns? Aaron Aas

## 2024-02-17 ENCOUNTER — Ambulatory Visit: Admitting: Internal Medicine

## 2024-02-22 NOTE — Anesthesia Preprocedure Evaluation (Signed)
 Anesthesia Evaluation    Reviewed: Allergy & Precautions, H&P , Patient's Chart, lab work & pertinent test results, Unable to perform ROS - Chart review only  Airway        Dental   Pulmonary neg pulmonary ROS, Current Smoker   Pulmonary exam normal        Cardiovascular Exercise Tolerance: Good hypertension,      Neuro/Psych  PSYCHIATRIC DISORDERS Anxiety  Bipolar Disorder   negative neurological ROS     GI/Hepatic negative GI ROS, Neg liver ROS,,,  Endo/Other  negative endocrine ROS    Renal/GU negative Renal ROS     Musculoskeletal   Abdominal   Peds  Hematology negative hematology ROS (+)   Anesthesia Other Findings   Reproductive/Obstetrics                             Anesthesia Physical Anesthesia Plan  ASA: 2  Anesthesia Plan:    Post-op Pain Management: Minimal or no pain anticipated   Induction:   PONV Risk Score and Plan: Propofol  infusion and TIVA  Airway Management Planned:   Additional Equipment:   Intra-op Plan:   Post-operative Plan:   Informed Consent: I have reviewed the patients History and Physical, chart, labs and discussed the procedure including the risks, benefits and alternatives for the proposed anesthesia with the patient or authorized representative who has indicated his/her understanding and acceptance.     Dental Advisory Given  Plan Discussed with: CRNA  Anesthesia Plan Comments: (Note 07/2023 "For full details please see admission history and physical briefly, 58 year old female with past medical history of essential hypertension, hyperlipidemia, tobacco abuse presented to the emergency department complaining of chest pain. This started when she woke up this morning around 7 AM. She had sudden onset of chest tightness radiating to her back that was sharp in nature associate with nausea and diaphoresis. She summoned EMS. She received 2 baby  aspirin  and nitroglycerin  without relief of her symptoms. Symptoms resolved after receiving morphine  in the ED.  Hospital Course:   Noncardiac chest pain- patient ruled out for ACS with multiple serial cardiac enzymes that remain negative. She underwent Lexiscan Cardiolite stress testing demonstrating no inducible ischemia. Patient was recently started on pantoprazole for acid reflux type symptoms and this is likely the source of her chest pain. She reported being chest pain-free overnight. CT angio chest abdomen pelvis was performed demonstrating no acute pathology. Patient was encouraged to continue taking her pantoprazole daily as she has just started it recently and follow-up with her primary care provider.  GERD-see above, continue pantoprazole. Consider referral to GI for possible EGD if symptoms do not improve on current therapy.")       Anesthesia Quick Evaluation

## 2024-02-23 ENCOUNTER — Encounter (HOSPITAL_COMMUNITY): Payer: Self-pay | Admitting: Certified Registered Nurse Anesthetist

## 2024-02-23 ENCOUNTER — Encounter: Payer: Self-pay | Admitting: Gastroenterology

## 2024-02-23 ENCOUNTER — Ambulatory Visit (HOSPITAL_COMMUNITY): Admission: RE | Admit: 2024-02-23 | Payer: Medicaid Other | Source: Home / Self Care | Admitting: Gastroenterology

## 2024-02-23 ENCOUNTER — Encounter (HOSPITAL_COMMUNITY): Admission: RE | Payer: Self-pay | Source: Home / Self Care

## 2024-02-23 DIAGNOSIS — K839 Disease of biliary tract, unspecified: Secondary | ICD-10-CM | POA: Insufficient documentation

## 2024-02-23 SURGERY — UPPER ENDOSCOPIC ULTRASOUND (EUS) RADIAL
Anesthesia: Monitor Anesthesia Care

## 2024-02-23 NOTE — Progress Notes (Signed)
 Patient no show for endoscopy. Left message in that morning at 0700ish . Patient did not return call.

## 2024-02-23 NOTE — Progress Notes (Signed)
 This patient no showed for upper EUS. She already had a late cancellation earlier this year so this was her second rescheduling.  Looks like they could never get a hold of her and had to leave a voicemail when they tried to prereach her last week.  I will forward this to the patient's primary gastroenterologist.  We will allow the patient and primary gastroenterologist to reach back out to us  if they want to have any further procedures scheduled.  If this patient is can be scheduled, she cannot miss another appointment otherwise she will need to have her EUS done elsewhere.  Yong Henle, MD [facility] Office # [phone]

## 2024-03-02 NOTE — Progress Notes (Signed)
 Hi Dr Brice Campi . Thank you for informing me about this.  We will speak to the patient   Hi Amalia Badder,  Can you please schedule a follow up appointment for this patient in 4-6 weeks with me or any of the APPs?  Thanks,  Vincient Vanaman Faizan Guiselle Mian , MD Gastroenterology and Hepatology [facility]

## 2024-03-07 NOTE — Progress Notes (Deleted)
 Joyce Hartman, female    DOB: [birth year]    MRN: [id number]   Brief patient profile:  58  yo*** *** referred to pulmonary clinic in [place]  03/10/2024 by *** for ***   Pt not previously seen by PCCM service.     History of Present Illness  03/10/2024  Pulmonary/ 1st office eval/ Tori Dattilio / [place] [facility]  No chief complaint on file.    Dyspnea:  *** Cough: *** Sleep: *** SABA use: *** 02: *** LDSCT:***  No obvious day to day or daytime pattern/variability or assoc excess/ purulent sputum or mucus plugs or hemoptysis or cp or chest tightness, subjective wheeze or overt sinus or hb symptoms.    Also denies any obvious fluctuation of symptoms with weather or environmental changes or other aggravating or alleviating factors except as outlined above   No unusual exposure hx or h/o childhood pna/ asthma or knowledge of premature birth.  Current Allergies, Complete Past Medical History, Past Surgical History, Family History, and Social History were reviewed in [employer] record.  ROS  The following are not active complaints unless bolded Hoarseness, sore throat, dysphagia, dental problems, itching, sneezing,  nasal congestion or discharge of excess mucus or purulent secretions, ear ache,   fever, chills, sweats, unintended wt loss or wt gain, classically pleuritic or exertional cp,  orthopnea pnd or arm/hand swelling  or leg swelling, presyncope, palpitations, abdominal pain, anorexia, nausea, vomiting, diarrhea  or change in bowel habits or change in bladder habits, change in stools or change in urine, dysuria, hematuria,  rash, arthralgias, visual complaints, headache, numbness, weakness or ataxia or problems with walking or coordination,  change in mood or  memory.            Outpatient Medications Prior to Visit  Medication Sig Dispense Refill   atorvastatin (LIPITOR) 40 MG tablet Take 40 mg by mouth daily.     CAPLYTA 42 MG capsule Take 42 mg by  mouth daily.     cetirizine (ZYRTEC) 10 MG tablet Take 10 mg by mouth daily.     fluticasone (FLONASE) 50 MCG/ACT nasal spray Place 1 spray into both nostrils daily.     furosemide (LASIX) 20 MG tablet Take 20 mg by mouth daily.     gabapentin (NEURONTIN) 300 MG capsule Take 300 mg by mouth 2 (two) times daily.     HYDROcodone-acetaminophen (NORCO/VICODIN) 5-325 MG tablet Take 1 tablet by mouth every 6 (six) hours as needed.     ibuprofen (ADVIL,MOTRIN) 200 MG tablet Take 200 mg by mouth every 6 (six) hours as needed for fever.     lisinopril (ZESTRIL) 20 MG tablet Take 20 mg by mouth daily.     melatonin 3 MG TABS tablet Take 3 mg by mouth at bedtime.     Multiple Vitamin (MULTIVITAMIN) capsule Take 1 capsule by mouth daily.     ondansetron  (ZOFRAN ) 4 MG tablet Take 4 mg by mouth. As needed     pantoprazole (PROTONIX) 40 MG tablet Take 40 mg by mouth every morning.     No facility-administered medications prior to visit.    Past Medical History:  Diagnosis Date   Anxiety    Back pain    Bipolar 1 disorder (HCC)    High cholesterol    Hypertension    PTSD (post-traumatic stress disorder)       Objective:     There were no vitals taken for this visit.  Assessment   No problem-specific Assessment & Plan notes found for this encounter.     Vernestine Gondola, MD 03/07/2024

## 2024-03-10 ENCOUNTER — Encounter: Payer: Self-pay | Admitting: Internal Medicine

## 2024-03-10 ENCOUNTER — Ambulatory Visit: Admitting: Internal Medicine

## 2024-04-06 ENCOUNTER — Ambulatory Visit (INDEPENDENT_AMBULATORY_CARE_PROVIDER_SITE_OTHER): Admitting: Gastroenterology

## 2024-04-16 ENCOUNTER — Ambulatory Visit (INDEPENDENT_AMBULATORY_CARE_PROVIDER_SITE_OTHER): Admitting: Gastroenterology

## 2024-08-16 ENCOUNTER — Ambulatory Visit (INDEPENDENT_AMBULATORY_CARE_PROVIDER_SITE_OTHER): Admitting: Gastroenterology
